# Patient Record
Sex: Female | Born: 1960 | Race: White | Hispanic: No | Marital: Married | Smoking: Former smoker
Health system: Southern US, Community
[De-identification: ages and names within clinical notes are randomized; demographics above are authoritative.]

## PROBLEM LIST (undated history)

## (undated) DIAGNOSIS — G4700 Insomnia, unspecified: Secondary | ICD-10-CM

## (undated) DIAGNOSIS — E7800 Pure hypercholesterolemia, unspecified: Secondary | ICD-10-CM

## (undated) DIAGNOSIS — F32A Depression, unspecified: Secondary | ICD-10-CM

## (undated) DIAGNOSIS — F419 Anxiety disorder, unspecified: Secondary | ICD-10-CM

## (undated) DIAGNOSIS — D259 Leiomyoma of uterus, unspecified: Secondary | ICD-10-CM

## (undated) DIAGNOSIS — F329 Major depressive disorder, single episode, unspecified: Secondary | ICD-10-CM

## (undated) DIAGNOSIS — K635 Polyp of colon: Secondary | ICD-10-CM

## (undated) HISTORY — DX: Polyp of colon: K63.5

## (undated) HISTORY — DX: Pure hypercholesterolemia, unspecified: E78.00

## (undated) HISTORY — PX: WRIST SURGERY: SHX841

---

## 1998-02-28 ENCOUNTER — Other Ambulatory Visit: Admission: RE | Admit: 1998-02-28 | Discharge: 1998-02-28 | Payer: Self-pay | Admitting: Obstetrics and Gynecology

## 1999-03-04 ENCOUNTER — Other Ambulatory Visit: Admission: RE | Admit: 1999-03-04 | Discharge: 1999-03-04 | Payer: Self-pay | Admitting: Obstetrics and Gynecology

## 2000-05-08 ENCOUNTER — Other Ambulatory Visit: Admission: RE | Admit: 2000-05-08 | Discharge: 2000-05-08 | Payer: Self-pay | Admitting: Obstetrics and Gynecology

## 2001-08-16 ENCOUNTER — Other Ambulatory Visit: Admission: RE | Admit: 2001-08-16 | Discharge: 2001-08-16 | Payer: Self-pay | Admitting: Obstetrics and Gynecology

## 2003-01-13 ENCOUNTER — Other Ambulatory Visit: Admission: RE | Admit: 2003-01-13 | Discharge: 2003-01-13 | Payer: Self-pay | Admitting: Obstetrics and Gynecology

## 2004-01-26 ENCOUNTER — Other Ambulatory Visit: Admission: RE | Admit: 2004-01-26 | Discharge: 2004-01-26 | Payer: Self-pay | Admitting: Obstetrics and Gynecology

## 2005-06-26 ENCOUNTER — Other Ambulatory Visit: Admission: RE | Admit: 2005-06-26 | Discharge: 2005-06-26 | Payer: Self-pay | Admitting: Obstetrics and Gynecology

## 2007-12-05 ENCOUNTER — Emergency Department (HOSPITAL_BASED_OUTPATIENT_CLINIC_OR_DEPARTMENT_OTHER): Admission: EM | Admit: 2007-12-05 | Discharge: 2007-12-05 | Payer: Self-pay | Admitting: Emergency Medicine

## 2010-09-20 ENCOUNTER — Other Ambulatory Visit: Payer: Self-pay | Admitting: Obstetrics and Gynecology

## 2010-09-20 DIAGNOSIS — Z09 Encounter for follow-up examination after completed treatment for conditions other than malignant neoplasm: Secondary | ICD-10-CM

## 2010-09-20 DIAGNOSIS — N6459 Other signs and symptoms in breast: Secondary | ICD-10-CM

## 2011-01-22 DIAGNOSIS — E569 Vitamin deficiency, unspecified: Secondary | ICD-10-CM | POA: Insufficient documentation

## 2011-01-22 DIAGNOSIS — G4700 Insomnia, unspecified: Secondary | ICD-10-CM | POA: Insufficient documentation

## 2011-02-27 ENCOUNTER — Encounter: Payer: Self-pay | Admitting: *Deleted

## 2011-02-27 ENCOUNTER — Emergency Department (INDEPENDENT_AMBULATORY_CARE_PROVIDER_SITE_OTHER): Payer: BC Managed Care – PPO

## 2011-02-27 ENCOUNTER — Emergency Department (HOSPITAL_BASED_OUTPATIENT_CLINIC_OR_DEPARTMENT_OTHER)
Admission: EM | Admit: 2011-02-27 | Discharge: 2011-02-27 | Disposition: A | Discharge status: Home / Self Care | Payer: BC Managed Care – PPO | Attending: Emergency Medicine | Admitting: Emergency Medicine

## 2011-02-27 DIAGNOSIS — M542 Cervicalgia: Secondary | ICD-10-CM

## 2011-02-27 DIAGNOSIS — M79609 Pain in unspecified limb: Secondary | ICD-10-CM

## 2011-02-27 DIAGNOSIS — M47812 Spondylosis without myelopathy or radiculopathy, cervical region: Secondary | ICD-10-CM

## 2011-02-27 DIAGNOSIS — F341 Dysthymic disorder: Secondary | ICD-10-CM | POA: Insufficient documentation

## 2011-02-27 DIAGNOSIS — M5412 Radiculopathy, cervical region: Secondary | ICD-10-CM | POA: Insufficient documentation

## 2011-02-27 HISTORY — DX: Depression, unspecified: F32.A

## 2011-02-27 HISTORY — DX: Anxiety disorder, unspecified: F41.9

## 2011-02-27 HISTORY — DX: Insomnia, unspecified: G47.00

## 2011-02-27 HISTORY — DX: Major depressive disorder, single episode, unspecified: F32.9

## 2011-02-27 LAB — CBC
HCT: 35.7 % — ABNORMAL LOW (ref 36.0–46.0)
MCH: 29.9 pg (ref 26.0–34.0)
MCHC: 33.6 g/dL (ref 30.0–36.0)
MCV: 88.8 fL (ref 78.0–100.0)
Platelets: 286 10*3/uL (ref 150–400)
RDW: 12.9 % (ref 11.5–15.5)

## 2011-02-27 LAB — DIFFERENTIAL
Basophils Absolute: 0 10*3/uL (ref 0.0–0.1)
Basophils Relative: 0 % (ref 0–1)
Eosinophils Absolute: 0.2 10*3/uL (ref 0.0–0.7)
Eosinophils Relative: 2 % (ref 0–5)
Monocytes Absolute: 0.5 10*3/uL (ref 0.1–1.0)

## 2011-02-27 LAB — COMPREHENSIVE METABOLIC PANEL
AST: 13 U/L (ref 0–37)
CO2: 28 mEq/L (ref 19–32)
Calcium: 9.2 mg/dL (ref 8.4–10.5)
Creatinine, Ser: 0.6 mg/dL (ref 0.50–1.10)
GFR calc non Af Amer: >90 mL/min (ref >90)
Sodium: 139 mEq/L (ref 135–145)
Total Protein: 6.3 g/dL (ref 6.0–8.3)

## 2011-02-27 MED ORDER — METHOCARBAMOL 500 MG PO TABS: 500.0000 mg | ORAL_TABLET | Freq: Two times a day (BID) | ORAL | Status: AC

## 2011-02-27 MED ORDER — PREDNISONE 10 MG PO TABS: ORAL_TABLET | ORAL | Status: DC

## 2011-02-27 MED ORDER — HYDROCODONE-ACETAMINOPHEN 5-325 MG PO TABS: 2.0000 | ORAL_TABLET | ORAL | Status: AC | PRN

## 2011-02-27 MED ORDER — HYDROCODONE-ACETAMINOPHEN 5-325 MG PO TABS
1.0000 | ORAL_TABLET | Freq: Once | ORAL | Status: AC
  Administered 2011-02-27: 1 via ORAL
  Filled 2011-02-27: qty 1

## 2011-02-27 NOTE — ED Notes (Signed)
Pt reports that she had a stressful life changing event in March, started prozac and [place] shortly after that states she began losing weight and has lost 40 pounds since march however she does report that the weight loss has leveled off in the last couple of months

## 2011-02-27 NOTE — ED Notes (Signed)
Transported to xray 

## 2011-02-27 NOTE — ED Notes (Signed)
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 Pt also reports for the last few days she has become "lightheaded" at intervals

## 2011-02-27 NOTE — ED Provider Notes (Signed)
History     CSN: [id number] Arrival date & time: 02/27/2011 12:18 PM  Chief Complaint  Patient presents with  . Neck Pain    (Consider location/radiation/quality/duration/timing/severity/associated sxs/prior treatment) Patient is a 50 y.o. female presenting with neck pain. The history is provided by the patient. No language interpreter was used.  Neck Pain  This is a recurrent problem. The current episode started more than 1 week ago. The problem occurs constantly. The problem has been gradually worsening. The pain is associated with nothing. There has been no fever. The pain is present in the generalized neck. The quality of the pain is described as shooting. The pain is at a severity of 9/10. The pain is moderate. The symptoms are aggravated by bending. The pain is worse during the day. Associated symptoms include numbness. She has tried nothing for the symptoms. The treatment provided moderate relief.    Past Medical History  Diagnosis Date  . Depression   . Anxiety   . Insomnia     History reviewed. No pertinent past surgical history.  Family History  Problem Relation Age of Onset  . Thyroid disease Mother     History  Substance Use Topics  . Smoking status: Never Smoker   . Smokeless tobacco: Not on file  . Alcohol Use: Yes     occ    OB History    Grav Para Term Preterm Abortions TAB SAB Ect Mult Living                  Review of Systems  HENT: Positive for neck pain.   Musculoskeletal: Negative for myalgias and joint swelling.  Neurological: Positive for numbness.  All other systems reviewed and are negative.    Allergies  Review of patient's allergies indicates no known allergies.  Home Medications   Current Outpatient Rx  Name Route Sig Dispense Refill  . FLUOXETINE HCL 40 MG PO CAPS Oral Take 40 mg by mouth daily.      Marland Kitchen ZOLPIDEM TARTRATE 10 MG PO TABS Oral Take 10 mg by mouth at bedtime as needed.        BP 124/80  Pulse 66  Temp(Src) 98.5  F (36.9 C) (Oral)  Resp 20  SpO2 100%  LMP 02/15/2011  Physical Exam  Nursing note and vitals reviewed. Constitutional: She is oriented to person, place, and time. She appears well-developed and well-nourished.  HENT:  Head: Normocephalic and atraumatic.  Eyes: Conjunctivae and EOM are normal. Pupils are equal, round, and reactive to light.  Neck: Normal range of motion. Neck supple.  Cardiovascular: Normal rate.   Pulmonary/Chest: Effort normal.  Abdominal: Soft.  Musculoskeletal: Normal range of motion. She exhibits tenderness.  Neurological: She is alert and oriented to person, place, and time. She has normal reflexes.  Skin: Skin is warm and dry.  Psychiatric: She has a normal mood and affect.    ED Course  Procedures (including critical care time)  Labs Reviewed - No data to display No results found.   No diagnosis found.    MDM  C spine shows spurring and neural foraminal narrowing.   Pt referred to her MD for followup,  Dr. Reinaldo Raddle neurosurgery.  Pt started on prednisone and hydrocodone        Langston Masker, [place] 02/27/11 1430

## 2011-03-02 NOTE — ED Provider Notes (Signed)
Medical screening examination/treatment/procedure(s) were performed by non-physician practitioner and as supervising physician I was immediately available for consultation/collaboration. Gerhard Munch, MD, MA  Gerhard Munch, MD 03/02/11 2142

## 2011-09-23 ENCOUNTER — Other Ambulatory Visit: Payer: Self-pay | Admitting: Obstetrics and Gynecology

## 2011-09-23 DIAGNOSIS — N6459 Other signs and symptoms in breast: Secondary | ICD-10-CM

## 2011-10-03 ENCOUNTER — Other Ambulatory Visit: Payer: BC Managed Care – PPO

## 2011-11-03 ENCOUNTER — Other Ambulatory Visit: Payer: BC Managed Care – PPO

## 2011-11-10 ENCOUNTER — Other Ambulatory Visit: Payer: BC Managed Care – PPO

## 2012-10-01 ENCOUNTER — Other Ambulatory Visit: Payer: Self-pay

## 2012-10-01 DIAGNOSIS — Z1231 Encounter for screening mammogram for malignant neoplasm of breast: Secondary | ICD-10-CM

## 2012-11-02 ENCOUNTER — Ambulatory Visit
Admission: RE | Admit: 2012-11-02 | Discharge: 2012-11-02 | Disposition: A | Discharge status: Home / Self Care | Payer: BC Managed Care – PPO | Source: Ambulatory Visit

## 2012-11-02 DIAGNOSIS — Z1231 Encounter for screening mammogram for malignant neoplasm of breast: Secondary | ICD-10-CM

## 2014-03-23 DIAGNOSIS — R87610 Atypical squamous cells of undetermined significance on cytologic smear of cervix (ASC-US): Secondary | ICD-10-CM | POA: Insufficient documentation

## 2016-05-03 HISTORY — PX: ABDOMINAL HYSTERECTOMY: SHX81

## 2016-05-07 ENCOUNTER — Observation Stay (HOSPITAL_COMMUNITY)
Admission: AD | Admit: 2016-05-07 | Discharge: 2016-05-09 | Disposition: A | Discharge status: Home / Self Care | Payer: BLUE CROSS/BLUE SHIELD | Source: Ambulatory Visit | Attending: Obstetrics and Gynecology | Admitting: Obstetrics and Gynecology

## 2016-05-07 ENCOUNTER — Encounter (HOSPITAL_COMMUNITY): Payer: Self-pay | Admitting: *Deleted

## 2016-05-07 DIAGNOSIS — R5082 Postprocedural fever: Principal | ICD-10-CM

## 2016-05-07 HISTORY — DX: Leiomyoma of uterus, unspecified: D25.9

## 2016-05-07 LAB — CBC WITH DIFFERENTIAL/PLATELET
BASOS ABS: 0 10*3/uL (ref 0.0–0.1)
Basophils Relative: 0 %
Eosinophils Absolute: 0.1 10*3/uL (ref 0.0–0.7)
Eosinophils Relative: 0 %
HEMATOCRIT: 33.6 % — AB (ref 36.0–46.0)
HEMOGLOBIN: 11 g/dL — AB (ref 12.0–15.0)
LYMPHS PCT: 13 %
Lymphs Abs: 2.3 10*3/uL (ref 0.7–4.0)
MCH: 26.6 pg (ref 26.0–34.0)
MCHC: 32.7 g/dL (ref 30.0–36.0)
MCV: 81.2 fL (ref 78.0–100.0)
MONO ABS: 0.7 10*3/uL (ref 0.1–1.0)
Monocytes Relative: 4 %
NEUTROS ABS: 14.4 10*3/uL — AB (ref 1.7–7.7)
NEUTROS PCT: 83 %
Platelets: 346 10*3/uL (ref 150–400)
RBC: 4.14 MIL/uL (ref 3.87–5.11)
RDW: 14 % (ref 11.5–15.5)
WBC: 17.5 10*3/uL — ABNORMAL HIGH (ref 4.0–10.5)

## 2016-05-07 LAB — INFLUENZA PANEL BY PCR (TYPE A & B)
Influenza A By PCR: NEGATIVE
Influenza B By PCR: NEGATIVE

## 2016-05-07 LAB — URINALYSIS, ROUTINE W REFLEX MICROSCOPIC
Bacteria, UA: NONE SEEN
Bilirubin Urine: NEGATIVE
GLUCOSE, UA: NEGATIVE mg/dL
Ketones, ur: 5 mg/dL — AB
Nitrite: NEGATIVE
PROTEIN: NEGATIVE mg/dL
Specific Gravity, Urine: 1.01 (ref 1.005–1.030)
pH: 6 (ref 5.0–8.0)

## 2016-05-07 MED ORDER — ONDANSETRON HCL 4 MG PO TABS: 4.0000 mg | ORAL_TABLET | Freq: Four times a day (QID) | ORAL | Status: DC | PRN

## 2016-05-07 MED ORDER — ONDANSETRON HCL 4 MG/2ML IJ SOLN: 4.0000 mg | Freq: Four times a day (QID) | INTRAMUSCULAR | Status: DC | PRN

## 2016-05-07 MED ORDER — POLYETHYLENE GLYCOL 3350 17 G PO PACK
17.0000 g | PACK | Freq: Every day | ORAL | Status: DC
  Administered 2016-05-07 – 2016-05-09 (×3): 17 g via ORAL
  Filled 2016-05-07 (×3): qty 1

## 2016-05-07 MED ORDER — PIPERACILLIN-TAZOBACTAM 3.375 G IVPB 30 MIN
3.3750 g | Freq: Once | INTRAVENOUS | Status: AC
  Administered 2016-05-07: 3.375 g via INTRAVENOUS
  Filled 2016-05-07: qty 50

## 2016-05-07 MED ORDER — PIPERACILLIN-TAZOBACTAM 3.375 G IVPB
3.3750 g | Freq: Three times a day (TID) | INTRAVENOUS | Status: DC
  Administered 2016-05-08 – 2016-05-09 (×4): 3.375 g via INTRAVENOUS
  Filled 2016-05-07 (×5): qty 50

## 2016-05-07 MED ORDER — SODIUM CHLORIDE 0.9 % IV SOLN
INTRAVENOUS | Status: DC
  Administered 2016-05-07 – 2016-05-08 (×2): via INTRAVENOUS

## 2016-05-07 MED ORDER — FLUTICASONE PROPIONATE 50 MCG/ACT NA SUSP
2.0000 | Freq: Every day | NASAL | Status: DC
  Filled 2016-05-07: qty 16

## 2016-05-07 MED ORDER — MENTHOL 3 MG MT LOZG: 1.0000 | LOZENGE | OROMUCOSAL | Status: DC | PRN

## 2016-05-07 MED ORDER — OXYCODONE-ACETAMINOPHEN 5-325 MG PO TABS
1.0000 | ORAL_TABLET | ORAL | Status: DC | PRN
  Filled 2016-05-07: qty 1

## 2016-05-07 MED ORDER — TRIAMCINOLONE ACETONIDE 55 MCG/ACT NA AERO
2.0000 | INHALATION_SPRAY | Freq: Every day | NASAL | Status: DC
  Filled 2016-05-07: qty 21.6

## 2016-05-07 MED ORDER — BISACODYL 10 MG RE SUPP
10.0000 mg | Freq: Every day | RECTAL | Status: DC | PRN
  Administered 2016-05-08 – 2016-05-09 (×2): 10 mg via RECTAL
  Filled 2016-05-07 (×2): qty 1

## 2016-05-07 MED ORDER — IBUPROFEN 600 MG PO TABS
600.0000 mg | ORAL_TABLET | Freq: Four times a day (QID) | ORAL | Status: DC | PRN
  Administered 2016-05-07 – 2016-05-08 (×3): 600 mg via ORAL
  Filled 2016-05-07 (×3): qty 1

## 2016-05-07 MED ORDER — MAGNESIUM HYDROXIDE 400 MG/5ML PO SUSP
15.0000 mL | Freq: Every day | ORAL | Status: DC
  Administered 2016-05-07 – 2016-05-09 (×2): 15 mL via ORAL
  Filled 2016-05-07 (×2): qty 30

## 2016-05-07 MED ORDER — ACETAMINOPHEN 325 MG PO TABS
650.0000 mg | ORAL_TABLET | Freq: Four times a day (QID) | ORAL | Status: DC | PRN
  Administered 2016-05-08 – 2016-05-09 (×4): 650 mg via ORAL
  Filled 2016-05-07 (×4): qty 2

## 2016-05-07 MED ORDER — ACETAMINOPHEN 325 MG PO TABS
650.0000 mg | ORAL_TABLET | Freq: Once | ORAL | Status: AC
  Administered 2016-05-07: 650 mg via ORAL
  Filled 2016-05-07: qty 2

## 2016-05-07 NOTE — MAU Note (Addendum)
Pt had hysterectomy on 12/4 by Dr. Gaetano Net, began having severe HA last night, is somewhat better now.  Unable to eat, cramping, felt like she had a fever.  Temp was 101.6 @ 1700 this evening. Pt last took tylenol @ 1400.  Was advised to come to MAU.

## 2016-05-07 NOTE — H&P (Signed)
Gabriela Howard is an 55 y.o. female s/p LAVH 04/28/16 presents w/ fever.  Pt reports severe HA today and feeling unwell.  Temp @ home 101.7.   Pt reports persistent cramping since surgery - using tylenol prn for pain.  Pt reports last BM 3 d ago and flatus 2d ago.  Tolerating a regular diet until starting feeling bad today - reports decrease in appetite - no emesis.  Denies cough, CP & SOB.  Denies dysuria, hematuria, & frequency of urination.  Denies VB.  Reports HA now improved.     Menstrual History: Patient's last menstrual period was 02/15/2011.    Past Medical History:  Diagnosis Date  . Anxiety   . Depression   . Insomnia   . Uterine fibroid     Past Surgical History:  Procedure Laterality Date  . ABDOMINAL HYSTERECTOMY  05/03/2016   LAVH, total  . WRIST SURGERY      Family History  Problem Relation Age of Onset  . Thyroid disease Mother     Social History:  reports that she has never smoked. She has never used smokeless tobacco. She reports that she drinks alcohol. She reports that she does not use drugs.  Allergies:  Allergies  Allergen Reactions  . Aleve [Naproxen Sodium] Other (See Comments)    Reaction:  GI upset   . Sulfa Antibiotics Other (See Comments)    Reaction:  Unknown     Prescriptions Prior to Admission  Medication Sig Dispense Refill Last Dose  . cholecalciferol (VITAMIN D) 1000 units tablet Take 2,000 Units by mouth daily.   Past Month at Unknown time  . magnesium hydroxide (MILK OF MAGNESIA) 400 MG/5ML suspension Take 30 mLs by mouth daily as needed for mild constipation.   Past Week at Unknown time  . Multiple Vitamin (MULTIVITAMIN WITH MINERALS) TABS tablet Take 1 tablet by mouth daily.   Past Month at Unknown time  . oxyCODONE-acetaminophen (PERCOCET/ROXICET) 5-325 MG tablet Take 1-2 tablets by mouth every 6 (six) hours as needed for severe pain.   Past Week at Unknown time  . triamcinolone (NASACORT ALLERGY 24HR) 55 MCG/ACT AERO nasal inhaler  Place 2 sprays into the nose at bedtime as needed (for rhinitis).   Past Week at Unknown time  . Turmeric 500 MG CAPS Take 1,000 mg by mouth at bedtime.   05/06/2016 at Unknown time    ROS  Blood pressure 125/75, pulse 98, temperature 99.1 F (37.3 C), temperature source Oral, resp. rate 20, height 5\' 3"  (1.6 m), weight 167 lb 0.9 oz (75.8 kg), last menstrual period 02/15/2011, SpO2 96 %. Physical Exam  Gen - NAD CV - RRR Lungs - clear Abd - soft, ND, mildly tender BLQ.  + BS.  No R/G Ext - NT PV - deferred  Results for orders placed or performed during the hospital encounter of 05/07/16 (from the past 24 hour(s))  Urinalysis, Routine w reflex microscopic     Status: Abnormal   Collection Time: 05/07/16  6:57 PM  Result Value Ref Range   Color, Urine YELLOW YELLOW   APPearance CLEAR CLEAR   Specific Gravity, Urine 1.010 1.005 - 1.030   pH 6.0 5.0 - 8.0   Glucose, UA NEGATIVE NEGATIVE mg/dL   Hgb urine dipstick MODERATE (A) NEGATIVE   Bilirubin Urine NEGATIVE NEGATIVE   Ketones, ur 5 (A) NEGATIVE mg/dL   Protein, ur NEGATIVE NEGATIVE mg/dL   Nitrite NEGATIVE NEGATIVE   Leukocytes, UA TRACE (A) NEGATIVE   RBC /  HPF 0-5 0 - 5 RBC/hpf   WBC, UA 6-30 0 - 5 WBC/hpf   Bacteria, UA NONE SEEN NONE SEEN   Squamous Epithelial / LPF 0-5 (A) NONE SEEN   Mucous PRESENT   CBC with Differential     Status: Abnormal   Collection Time: 05/07/16  7:23 PM  Result Value Ref Range   WBC 17.5 (H) 4.0 - 10.5 K/uL   RBC 4.14 3.87 - 5.11 MIL/uL   Hemoglobin 11.0 (L) 12.0 - 15.0 g/dL   HCT 33.6 (L) 36.0 - 46.0 %   MCV 81.2 78.0 - 100.0 fL   MCH 26.6 26.0 - 34.0 pg   MCHC 32.7 30.0 - 36.0 g/dL   RDW 14.0 11.5 - 15.5 %   Platelets 346 150 - 400 K/uL   Neutrophils Relative % 83 %   Neutro Abs 14.4 (H) 1.7 - 7.7 K/uL   Lymphocytes Relative 13 %   Lymphs Abs 2.3 0.7 - 4.0 K/uL   Monocytes Relative 4 %   Monocytes Absolute 0.7 0.1 - 1.0 K/uL   Eosinophils Relative 0 %   Eosinophils Absolute 0.1  0.0 - 0.7 K/uL   Basophils Relative 0 %   Basophils Absolute 0.0 0.0 - 0.1 K/uL    Assessment/Plan: Postoperative fever s/p LAVH Admit IV zosyn IVF Repeat cbc in am - will consider imaging if not responding to broad spectrum abx Start bowel regimen - MOM, miralax, dulcolax.   Tylenol & ibuprofen prn pain (pt has tolerated ibu in past, declines narcotics b/c of constipation  Faye Strohman 05/07/2016, 9:23 PM

## 2016-05-07 NOTE — MAU Provider Note (Signed)
History     CSN: [id number]  Arrival date and time: 05/07/16 [id number]   First Provider Initiated Contact with Patient 05/07/16 1924       Chief Complaint  Patient presents with  . Fever  . Post-op Problem   HPI  Gabriela Howard is a 55 y.o. female who presents 9 days s/p LAVH with fever. Reports "feeling bad" since last night & felt like she may have a fever but didn't check her temperature until this evening when her spouse bought a thermometer; at 5 pm her temp was 101.5. Took 2 ES tylenol at 2 pm. Endorses headache since last night. Rates pain 7/10. Nothing makes better or worse. Denies sore throat, cough, SOB, dysuria, body aches, or sick contacts. Received flu vaccine this season. Some abdominal cramping since surgery. Reports cramping as mild and no change since surgery. No vaginal bleeding. Some brown discharge since surgery.   Past Medical History:  Diagnosis Date  . Anxiety   . Depression   . Insomnia   . Uterine fibroid     Past Surgical History:  Procedure Laterality Date  . ABDOMINAL HYSTERECTOMY  05/03/2016   LAVH, total  . WRIST SURGERY      Family History  Problem Relation Age of Onset  . Thyroid disease Mother     Social History  Substance Use Topics  . Smoking status: Never Smoker  . Smokeless tobacco: Not on file  . Alcohol use Yes     Comment: occ    Allergies: No Known Allergies  Prescriptions Prior to Admission  Medication Sig Dispense Refill Last Dose  . FLUoxetine (PROZAC) 40 MG capsule Take 40 mg by mouth daily.     More than a month at Unknown time  . predniSONE (DELTASONE) 10 MG tablet 6,5,4,3,2,1 taper 15 tablet 0 More than a month at Unknown time  . zolpidem (AMBIEN) 10 MG tablet Take 10 mg by mouth at bedtime as needed.     More than a month at Unknown time    Review of Systems  Constitutional: Positive for chills and fever. Negative for malaise/fatigue.  HENT: Negative.   Respiratory: Negative.   Cardiovascular: Negative.    Gastrointestinal: Positive for abdominal pain and constipation. Negative for diarrhea, nausea and vomiting.  Genitourinary: Negative.   Neurological: Positive for headaches.   Physical Exam   Blood pressure 136/79, pulse 113, temperature 102 F (38.9 C), temperature source Oral, resp. rate 18, last menstrual period 02/15/2011.  Temp:  [100.7 F (38.2 C)-102 F (38.9 C)] 100.7 F (38.2 C) (12/13 1950) Pulse Rate:  [89-113] 89 (12/13 1927) Resp:  [18] 18 (12/13 1927) BP: (106-136)/(73-79) 106/73 (12/13 1927)   Physical Exam  Nursing note and vitals reviewed. Constitutional: She is oriented to person, place, and time. She appears well-developed and well-nourished. No distress.  HENT:  Head: Normocephalic and atraumatic.  Eyes: Conjunctivae are normal. Right eye exhibits no discharge. Left eye exhibits no discharge. No scleral icterus.  Neck: Normal range of motion.  Cardiovascular: Regular rhythm and normal heart sounds.  Tachycardia present.   No murmur heard. Respiratory: Effort normal and breath sounds normal. No respiratory distress. She has no wheezes.  GI: Soft. Bowel sounds are normal. She exhibits no distension. There is tenderness (mild tenderness of lower abdomen). There is no rigidity, no rebound, no guarding and no CVA tenderness.  Neurological: She is alert and oriented to person, place, and time.  Skin: Skin is warm and dry. She is not diaphoretic.  Psychiatric: She has a normal mood and affect. Her behavior is normal. Judgment and thought content normal.    MAU Course  Procedures Results for orders placed or performed during the hospital encounter of 05/07/16 (from the past 24 hour(s))  Urinalysis, Routine w reflex microscopic     Status: Abnormal   Collection Time: 05/07/16  6:57 PM  Result Value Ref Range   Color, Urine YELLOW YELLOW   APPearance CLEAR CLEAR   Specific Gravity, Urine 1.010 1.005 - 1.030   pH 6.0 5.0 - 8.0   Glucose, UA NEGATIVE NEGATIVE  mg/dL   Hgb urine dipstick MODERATE (A) NEGATIVE   Bilirubin Urine NEGATIVE NEGATIVE   Ketones, ur 5 (A) NEGATIVE mg/dL   Protein, ur NEGATIVE NEGATIVE mg/dL   Nitrite NEGATIVE NEGATIVE   Leukocytes, UA TRACE (A) NEGATIVE   RBC / HPF 0-5 0 - 5 RBC/hpf   WBC, UA 6-30 0 - 5 WBC/hpf   Bacteria, UA NONE SEEN NONE SEEN   Squamous Epithelial / LPF 0-5 (A) NONE SEEN   Mucous PRESENT   CBC with Differential     Status: Abnormal   Collection Time: 05/07/16  7:23 PM  Result Value Ref Range   WBC 17.5 (H) 4.0 - 10.5 K/uL   RBC 4.14 3.87 - 5.11 MIL/uL   Hemoglobin 11.0 (L) 12.0 - 15.0 g/dL   HCT 33.6 (L) 36.0 - 46.0 %   MCV 81.2 78.0 - 100.0 fL   MCH 26.6 26.0 - 34.0 pg   MCHC 32.7 30.0 - 36.0 g/dL   RDW 14.0 11.5 - 15.5 %   Platelets 346 150 - 400 K/uL   Neutrophils Relative % 83 %   Neutro Abs 14.4 (H) 1.7 - 7.7 K/uL   Lymphocytes Relative 13 %   Lymphs Abs 2.3 0.7 - 4.0 K/uL   Monocytes Relative 4 %   Monocytes Absolute 0.7 0.1 - 1.0 K/uL   Eosinophils Relative 0 %   Eosinophils Absolute 0.1 0.0 - 0.7 K/uL   Basophils Relative 0 %   Basophils Absolute 0.0 0.0 - 0.1 K/uL    MDM CBC w/diff Tylenol 650 mg PO Temp 102>100.7 S/w Dr. Julien Girt. Will obs on Women's unit.   Assessment and Plan  A: Post op fever   P: Obs on women's unit IV fluids & abx Flu swab collected  Jorje Guild 05/07/2016, 7:24 PM

## 2016-05-08 LAB — COMPREHENSIVE METABOLIC PANEL
ALK PHOS: 57 U/L (ref 38–126)
ALT: 11 U/L — AB (ref 14–54)
ANION GAP: 6 (ref 5–15)
AST: 11 U/L — ABNORMAL LOW (ref 15–41)
Albumin: 3.3 g/dL — ABNORMAL LOW (ref 3.5–5.0)
BUN: 9 mg/dL (ref 6–20)
CO2: 28 mmol/L (ref 22–32)
CREATININE: 0.71 mg/dL (ref 0.44–1.00)
Calcium: 8.5 mg/dL — ABNORMAL LOW (ref 8.9–10.3)
Chloride: 107 mmol/L (ref 101–111)
Glucose, Bld: 102 mg/dL — ABNORMAL HIGH (ref 65–99)
Potassium: 4.2 mmol/L (ref 3.5–5.1)
Sodium: 141 mmol/L (ref 135–145)
Total Bilirubin: 0.5 mg/dL (ref 0.3–1.2)
Total Protein: 6.2 g/dL — ABNORMAL LOW (ref 6.5–8.1)

## 2016-05-08 LAB — CBC
HCT: 31.4 % — ABNORMAL LOW (ref 36.0–46.0)
HEMOGLOBIN: 10.5 g/dL — AB (ref 12.0–15.0)
MCH: 27.3 pg (ref 26.0–34.0)
MCHC: 33.4 g/dL (ref 30.0–36.0)
MCV: 81.8 fL (ref 78.0–100.0)
PLATELETS: 305 10*3/uL (ref 150–400)
RBC: 3.84 MIL/uL — AB (ref 3.87–5.11)
RDW: 14.2 % (ref 11.5–15.5)
WBC: 15.9 10*3/uL — ABNORMAL HIGH (ref 4.0–10.5)

## 2016-05-08 NOTE — Progress Notes (Signed)
Patient has been complaining of constant abdominal aching with a pain score 4-7 out of 10.  Pain med has been offered to patient every hour and she has refused.  She states "nothing helps. I don't want any thing". VSS , afebrile, skin warm and dry, active bowel sounds positive flatus, nontender, flat affect. Voiding without difficulty, no vaginal drainage.

## 2016-05-08 NOTE — Progress Notes (Signed)
Feeling much better today No HA, no N/V +BM after suppository Tolerated regular diet for breakfast Ambulating without difficulty   Vitals:   05/08/16 0428 05/08/16 0815  BP: (!) 91/52 (!) 98/52  Pulse: 75 86  Resp: 20 18  Temp: 97.8 F (36.6 C) 98.3 F (36.8 C)   Lungs CTA Abdomen soft, NT, good BS x 4 LE NT without cords  Results for orders placed or performed during the hospital encounter of 05/07/16 (from the past 24 hour(s))  Urinalysis, Routine w reflex microscopic     Status: Abnormal   Collection Time: 05/07/16  6:57 PM  Result Value Ref Range   Color, Urine YELLOW YELLOW   APPearance CLEAR CLEAR   Specific Gravity, Urine 1.010 1.005 - 1.030   pH 6.0 5.0 - 8.0   Glucose, UA NEGATIVE NEGATIVE mg/dL   Hgb urine dipstick MODERATE (A) NEGATIVE   Bilirubin Urine NEGATIVE NEGATIVE   Ketones, ur 5 (A) NEGATIVE mg/dL   Protein, ur NEGATIVE NEGATIVE mg/dL   Nitrite NEGATIVE NEGATIVE   Leukocytes, UA TRACE (A) NEGATIVE   RBC / HPF 0-5 0 - 5 RBC/hpf   WBC, UA 6-30 0 - 5 WBC/hpf   Bacteria, UA NONE SEEN NONE SEEN   Squamous Epithelial / LPF 0-5 (A) NONE SEEN   Mucous PRESENT   CBC with Differential     Status: Abnormal   Collection Time: 05/07/16  7:23 PM  Result Value Ref Range   WBC 17.5 (H) 4.0 - 10.5 K/uL   RBC 4.14 3.87 - 5.11 MIL/uL   Hemoglobin 11.0 (L) 12.0 - 15.0 g/dL   HCT 33.6 (L) 36.0 - 46.0 %   MCV 81.2 78.0 - 100.0 fL   MCH 26.6 26.0 - 34.0 pg   MCHC 32.7 30.0 - 36.0 g/dL   RDW 14.0 11.5 - 15.5 %   Platelets 346 150 - 400 K/uL   Neutrophils Relative % 83 %   Neutro Abs 14.4 (H) 1.7 - 7.7 K/uL   Lymphocytes Relative 13 %   Lymphs Abs 2.3 0.7 - 4.0 K/uL   Monocytes Relative 4 %   Monocytes Absolute 0.7 0.1 - 1.0 K/uL   Eosinophils Relative 0 %   Eosinophils Absolute 0.1 0.0 - 0.7 K/uL   Basophils Relative 0 %   Basophils Absolute 0.0 0.0 - 0.1 K/uL  Influenza panel by PCR (type A & B, H1N1)     Status: None   Collection Time: 05/07/16  8:27 PM   Result Value Ref Range   Influenza A By PCR NEGATIVE NEGATIVE   Influenza B By PCR NEGATIVE NEGATIVE  CBC     Status: Abnormal   Collection Time: 05/08/16  5:41 AM  Result Value Ref Range   WBC 15.9 (H) 4.0 - 10.5 K/uL   RBC 3.84 (L) 3.87 - 5.11 MIL/uL   Hemoglobin 10.5 (L) 12.0 - 15.0 g/dL   HCT 31.4 (L) 36.0 - 46.0 %   MCV 81.8 78.0 - 100.0 fL   MCH 27.3 26.0 - 34.0 pg   MCHC 33.4 30.0 - 36.0 g/dL   RDW 14.2 11.5 - 15.5 %   Platelets 305 150 - 400 K/uL  Comprehensive metabolic panel     Status: Abnormal   Collection Time: 05/08/16  5:41 AM  Result Value Ref Range   Sodium 141 135 - 145 mmol/L   Potassium 4.2 3.5 - 5.1 mmol/L   Chloride 107 101 - 111 mmol/L   CO2 28 22 - 32 mmol/L  Glucose, Bld 102 (H) 65 - 99 mg/dL   BUN 9 6 - 20 mg/dL   Creatinine, Ser 0.71 0.44 - 1.00 mg/dL   Calcium 8.5 (L) 8.9 - 10.3 mg/dL   Total Protein 6.2 (L) 6.5 - 8.1 g/dL   Albumin 3.3 (L) 3.5 - 5.0 g/dL   AST 11 (L) 15 - 41 U/L   ALT 11 (L) 14 - 54 U/L   Alkaline Phosphatase 57 38 - 126 U/L   Total Bilirubin 0.5 0.3 - 1.2 mg/dL   GFR calc non Af Amer >60 >60 mL/min   GFR calc Af Amer >60 >60 mL/min   Anion gap 6 5 - 15   A/P: Possible Cuff Cellulitis         Continue IV ATB until afebrile x 24 hours         D/W patient

## 2016-05-09 LAB — CBC
HCT: 31.8 % — ABNORMAL LOW (ref 36.0–46.0)
HEMOGLOBIN: 10.4 g/dL — AB (ref 12.0–15.0)
MCH: 27 pg (ref 26.0–34.0)
MCHC: 32.7 g/dL (ref 30.0–36.0)
MCV: 82.6 fL (ref 78.0–100.0)
Platelets: 306 10*3/uL (ref 150–400)
RBC: 3.85 MIL/uL — AB (ref 3.87–5.11)
RDW: 14.1 % (ref 11.5–15.5)
WBC: 11.7 10*3/uL — ABNORMAL HIGH (ref 4.0–10.5)

## 2016-05-09 MED ORDER — IBUPROFEN 600 MG PO TABS: 600.0000 mg | ORAL_TABLET | Freq: Four times a day (QID) | ORAL | 0 refills | Status: DC | PRN

## 2016-05-09 MED ORDER — AMOXICILLIN-POT CLAVULANATE 875-125 MG PO TABS: 1.0000 | ORAL_TABLET | Freq: Two times a day (BID) | ORAL | 0 refills | Status: DC

## 2016-05-09 NOTE — Progress Notes (Signed)
Feels much better +BM with suppository, tolerating regular diet, no N/V, ambulating well  VSS Afeb Abdomen very soft, good BS x 4  Results for orders placed or performed during the hospital encounter of 05/07/16 (from the past 48 hour(s))  Urinalysis, Routine w reflex microscopic     Status: Abnormal   Collection Time: 05/07/16  6:57 PM  Result Value Ref Range   Color, Urine YELLOW YELLOW   APPearance CLEAR CLEAR   Specific Gravity, Urine 1.010 1.005 - 1.030   pH 6.0 5.0 - 8.0   Glucose, UA NEGATIVE NEGATIVE mg/dL   Hgb urine dipstick MODERATE (A) NEGATIVE   Bilirubin Urine NEGATIVE NEGATIVE   Ketones, ur 5 (A) NEGATIVE mg/dL   Protein, ur NEGATIVE NEGATIVE mg/dL   Nitrite NEGATIVE NEGATIVE   Leukocytes, UA TRACE (A) NEGATIVE   RBC / HPF 0-5 0 - 5 RBC/hpf   WBC, UA 6-30 0 - 5 WBC/hpf   Bacteria, UA NONE SEEN NONE SEEN   Squamous Epithelial / LPF 0-5 (A) NONE SEEN   Mucous PRESENT   CBC with Differential     Status: Abnormal   Collection Time: 05/07/16  7:23 PM  Result Value Ref Range   WBC 17.5 (H) 4.0 - 10.5 K/uL   RBC 4.14 3.87 - 5.11 MIL/uL   Hemoglobin 11.0 (L) 12.0 - 15.0 g/dL   HCT 33.6 (L) 36.0 - 46.0 %   MCV 81.2 78.0 - 100.0 fL   MCH 26.6 26.0 - 34.0 pg   MCHC 32.7 30.0 - 36.0 g/dL   RDW 14.0 11.5 - 15.5 %   Platelets 346 150 - 400 K/uL   Neutrophils Relative % 83 %   Neutro Abs 14.4 (H) 1.7 - 7.7 K/uL   Lymphocytes Relative 13 %   Lymphs Abs 2.3 0.7 - 4.0 K/uL   Monocytes Relative 4 %   Monocytes Absolute 0.7 0.1 - 1.0 K/uL   Eosinophils Relative 0 %   Eosinophils Absolute 0.1 0.0 - 0.7 K/uL   Basophils Relative 0 %   Basophils Absolute 0.0 0.0 - 0.1 K/uL  Influenza panel by PCR (type A & B, H1N1)     Status: None   Collection Time: 05/07/16  8:27 PM  Result Value Ref Range   Influenza A By PCR NEGATIVE NEGATIVE   Influenza B By PCR NEGATIVE NEGATIVE    Comment: (NOTE) The Xpert Xpress Flu assay is intended as an aid in the diagnosis of  influenza and  should not be used as a sole basis for treatment.  This  assay is FDA approved for nasopharyngeal swab specimens only. Nasal  washings and aspirates are unacceptable for Xpert Xpress Flu testing.   CBC     Status: Abnormal   Collection Time: 05/08/16  5:41 AM  Result Value Ref Range   WBC 15.9 (H) 4.0 - 10.5 K/uL   RBC 3.84 (L) 3.87 - 5.11 MIL/uL   Hemoglobin 10.5 (L) 12.0 - 15.0 g/dL   HCT 31.4 (L) 36.0 - 46.0 %   MCV 81.8 78.0 - 100.0 fL   MCH 27.3 26.0 - 34.0 pg   MCHC 33.4 30.0 - 36.0 g/dL   RDW 14.2 11.5 - 15.5 %   Platelets 305 150 - 400 K/uL  Comprehensive metabolic panel     Status: Abnormal   Collection Time: 05/08/16  5:41 AM  Result Value Ref Range   Sodium 141 135 - 145 mmol/L   Potassium 4.2 3.5 - 5.1 mmol/L   Chloride  107 101 - 111 mmol/L   CO2 28 22 - 32 mmol/L   Glucose, Bld 102 (H) 65 - 99 mg/dL   BUN 9 6 - 20 mg/dL   Creatinine, Ser 0.71 0.44 - 1.00 mg/dL   Calcium 8.5 (L) 8.9 - 10.3 mg/dL   Total Protein 6.2 (L) 6.5 - 8.1 g/dL   Albumin 3.3 (L) 3.5 - 5.0 g/dL   AST 11 (L) 15 - 41 U/L   ALT 11 (L) 14 - 54 U/L   Alkaline Phosphatase 57 38 - 126 U/L   Total Bilirubin 0.5 0.3 - 1.2 mg/dL   GFR calc non Af Amer >60 >60 mL/min   GFR calc Af Amer >60 >60 mL/min    Comment: (NOTE) The eGFR has been calculated using the CKD EPI equation. This calculation has not been validated in all clinical situations. eGFR's persistently <60 mL/min signify possible Chronic Kidney Disease.    Anion gap 6 5 - 15  CBC     Status: Abnormal   Collection Time: 05/09/16  5:03 AM  Result Value Ref Range   WBC 11.7 (H) 4.0 - 10.5 K/uL   RBC 3.85 (L) 3.87 - 5.11 MIL/uL   Hemoglobin 10.4 (L) 12.0 - 15.0 g/dL   HCT 31.8 (L) 36.0 - 46.0 %   MCV 82.6 78.0 - 100.0 fL   MCH 27.0 26.0 - 34.0 pg   MCHC 32.7 30.0 - 36.0 g/dL   RDW 14.1 11.5 - 15.5 %   Platelets 306 150 - 400 K/uL   A/P: Probable cuff cellulitis-responding well to treatment         D/C home         Augmentin          D/W Milk of Magnesia prn, fluids, etc         FU office next week as scheduled

## 2016-05-09 NOTE — Progress Notes (Signed)
Results for Gabriela Howard, Gabriela Howard (MRN [id number]) as of 05/09/2016 06:28  Ref. Range 05/09/2016 05:03  WBC Latest Ref Range: 4.0 - 10.5 K/uL 11.7 (H)  RBC Latest Ref Range: 3.87 - 5.11 MIL/uL 3.85 (L)  Hemoglobin Latest Ref Range: 12.0 - 15.0 g/dL 10.4 (L)  HCT Latest Ref Range: 36.0 - 46.0 % 31.8 (L)  MCV Latest Ref Range: 78.0 - 100.0 fL 82.6  MCH Latest Ref Range: 26.0 - 34.0 pg 27.0  MCHC Latest Ref Range: 30.0 - 36.0 g/dL 32.7  RDW Latest Ref Range: 11.5 - 15.5 % 14.1  Platelets Latest Ref Range: 150 - 400 K/uL 306

## 2016-05-09 NOTE — Progress Notes (Signed)
Patient appears to be in better mood this am.  Talking, laughing, rating her pain 2 out of 10.  She has not taken any analgesia this night.  Stated she wants to go home.

## 2016-05-09 NOTE — Discharge Instructions (Signed)
No vaginal entry No operation of automobiles

## 2016-05-09 NOTE — Progress Notes (Signed)
Patient requested Tylenol for a pain score of 6-"little" cramping and aching.  She also requested Dulcolax suppository which was given.  Remains laughing and talking more this am then last night.

## 2016-05-09 NOTE — Discharge Summary (Signed)
Physician Discharge Summary  Patient ID: Gabriela Howard MRN: [id number] DOB/AGE: [birth year] 55 y.o.  Admit date: 05/07/2016 Discharge date: 05/09/2016  Admission Diagnoses:Postoperative fever  Discharge Diagnoses:  Active Problems:   Postoperative fever Cuff Cellulitis  Discharged Condition: good  Hospital Course: admitted for IV Zosyn, fluids. Had prompt response with good ambulation, good bladder function. Complains of some constipation. Had BM with rectal suppository. No N/V and tolerating regular diet well. Abdomen remains very soft and non-tender with good BS.   Consults: None  Significant Diagnostic Studies: labs:  Results for orders placed or performed during the hospital encounter of 05/07/16 (from the past 72 hour(s))  Urinalysis, Routine w reflex microscopic     Status: Abnormal   Collection Time: 05/07/16  6:57 PM  Result Value Ref Range   Color, Urine YELLOW YELLOW   APPearance CLEAR CLEAR   Specific Gravity, Urine 1.010 1.005 - 1.030   pH 6.0 5.0 - 8.0   Glucose, UA NEGATIVE NEGATIVE mg/dL   Hgb urine dipstick MODERATE (A) NEGATIVE   Bilirubin Urine NEGATIVE NEGATIVE   Ketones, ur 5 (A) NEGATIVE mg/dL   Protein, ur NEGATIVE NEGATIVE mg/dL   Nitrite NEGATIVE NEGATIVE   Leukocytes, UA TRACE (A) NEGATIVE   RBC / HPF 0-5 0 - 5 RBC/hpf   WBC, UA 6-30 0 - 5 WBC/hpf   Bacteria, UA NONE SEEN NONE SEEN   Squamous Epithelial / LPF 0-5 (A) NONE SEEN   Mucous PRESENT   CBC with Differential     Status: Abnormal   Collection Time: 05/07/16  7:23 PM  Result Value Ref Range   WBC 17.5 (H) 4.0 - 10.5 K/uL   RBC 4.14 3.87 - 5.11 MIL/uL   Hemoglobin 11.0 (L) 12.0 - 15.0 g/dL   HCT 33.6 (L) 36.0 - 46.0 %   MCV 81.2 78.0 - 100.0 fL   MCH 26.6 26.0 - 34.0 pg   MCHC 32.7 30.0 - 36.0 g/dL   RDW 14.0 11.5 - 15.5 %   Platelets 346 150 - 400 K/uL   Neutrophils Relative % 83 %   Neutro Abs 14.4 (H) 1.7 - 7.7 K/uL   Lymphocytes Relative 13 %   Lymphs Abs 2.3 0.7 - 4.0 K/uL    Monocytes Relative 4 %   Monocytes Absolute 0.7 0.1 - 1.0 K/uL   Eosinophils Relative 0 %   Eosinophils Absolute 0.1 0.0 - 0.7 K/uL   Basophils Relative 0 %   Basophils Absolute 0.0 0.0 - 0.1 K/uL  Influenza panel by PCR (type A & B, H1N1)     Status: None   Collection Time: 05/07/16  8:27 PM  Result Value Ref Range   Influenza A By PCR NEGATIVE NEGATIVE   Influenza B By PCR NEGATIVE NEGATIVE    Comment: (NOTE) The Xpert Xpress Flu assay is intended as an aid in the diagnosis of  influenza and should not be used as a sole basis for treatment.  This  assay is FDA approved for nasopharyngeal swab specimens only. Nasal  washings and aspirates are unacceptable for Xpert Xpress Flu testing.   CBC     Status: Abnormal   Collection Time: 05/08/16  5:41 AM  Result Value Ref Range   WBC 15.9 (H) 4.0 - 10.5 K/uL   RBC 3.84 (L) 3.87 - 5.11 MIL/uL   Hemoglobin 10.5 (L) 12.0 - 15.0 g/dL   HCT 31.4 (L) 36.0 - 46.0 %   MCV 81.8 78.0 - 100.0 fL   MCH 27.3  26.0 - 34.0 pg   MCHC 33.4 30.0 - 36.0 g/dL   RDW 14.2 11.5 - 15.5 %   Platelets 305 150 - 400 K/uL  Comprehensive metabolic panel     Status: Abnormal   Collection Time: 05/08/16  5:41 AM  Result Value Ref Range   Sodium 141 135 - 145 mmol/L   Potassium 4.2 3.5 - 5.1 mmol/L   Chloride 107 101 - 111 mmol/L   CO2 28 22 - 32 mmol/L   Glucose, Bld 102 (H) 65 - 99 mg/dL   BUN 9 6 - 20 mg/dL   Creatinine, Ser 0.71 0.44 - 1.00 mg/dL   Calcium 8.5 (L) 8.9 - 10.3 mg/dL   Total Protein 6.2 (L) 6.5 - 8.1 g/dL   Albumin 3.3 (L) 3.5 - 5.0 g/dL   AST 11 (L) 15 - 41 U/L   ALT 11 (L) 14 - 54 U/L   Alkaline Phosphatase 57 38 - 126 U/L   Total Bilirubin 0.5 0.3 - 1.2 mg/dL   GFR calc non Af Amer >60 >60 mL/min   GFR calc Af Amer >60 >60 mL/min    Comment: (NOTE) The eGFR has been calculated using the CKD EPI equation. This calculation has not been validated in all clinical situations. eGFR's persistently <60 mL/min signify possible Chronic  Kidney Disease.    Anion gap 6 5 - 15  CBC     Status: Abnormal   Collection Time: 05/09/16  5:03 AM  Result Value Ref Range   WBC 11.7 (H) 4.0 - 10.5 K/uL   RBC 3.85 (L) 3.87 - 5.11 MIL/uL   Hemoglobin 10.4 (L) 12.0 - 15.0 g/dL   HCT 31.8 (L) 36.0 - 46.0 %   MCV 82.6 78.0 - 100.0 fL   MCH 27.0 26.0 - 34.0 pg   MCHC 32.7 30.0 - 36.0 g/dL   RDW 14.1 11.5 - 15.5 %   Platelets 306 150 - 400 K/uL    Treatments: IV hydration and antibiotics: Zosyn  Discharge Exam: Blood pressure 100/62, pulse 82, temperature 98.8 F (37.1 C), temperature source Oral, resp. rate 14, height '5\' 3"'$  (1.6 m), weight 167 lb 0.9 oz (75.8 kg), last menstrual period 02/15/2011, SpO2 98 %. General appearance: alert, cooperative and no distress GI: soft, non-tender; bowel sounds normal; no masses,  no organomegaly  Disposition: 01-Home or Self Care   Allergies as of 05/09/2016      Reactions   Aleve [naproxen Sodium] Other (See Comments)   Reaction:  GI upset    Sulfa Antibiotics Other (See Comments)   Reaction:  Unknown       Medication List    STOP taking these medications   Turmeric 500 MG Caps     TAKE these medications   amoxicillin-clavulanate 875-125 MG tablet Commonly known as:  AUGMENTIN Take 1 tablet by mouth 2 (two) times daily.   cholecalciferol 1000 units tablet Commonly known as:  VITAMIN D Take 2,000 Units by mouth daily.   ibuprofen 600 MG tablet Commonly known as:  ADVIL,MOTRIN Take 1 tablet (600 mg total) by mouth every 6 (six) hours as needed for moderate pain.   magnesium hydroxide 400 MG/5ML suspension Commonly known as:  MILK OF MAGNESIA Take 30 mLs by mouth daily as needed for mild constipation.   multivitamin with minerals Tabs tablet Take 1 tablet by mouth daily.   NASACORT ALLERGY 24HR 55 MCG/ACT Aero nasal inhaler Generic drug:  triamcinolone Place 2 sprays into the nose at bedtime as needed (  for rhinitis).   oxyCODONE-acetaminophen 5-325 MG  tablet Commonly known as:  PERCOCET/ROXICET Take 1-2 tablets by mouth every 6 (six) hours as needed for severe pain.        Signed: Tymothy Cass II,Naryiah Schley E 05/09/2016, 7:57 AM

## 2016-05-09 NOTE — Progress Notes (Signed)
Teaching complete  Out ambulating well with husband

## 2016-06-08 ENCOUNTER — Encounter: Payer: Self-pay | Admitting: Emergency Medicine

## 2016-06-08 ENCOUNTER — Emergency Department
Admission: EM | Admit: 2016-06-08 | Discharge: 2016-06-08 | Disposition: A | Discharge status: Home / Self Care | Payer: BLUE CROSS/BLUE SHIELD | Source: Home / Self Care | Attending: Family Medicine | Admitting: Family Medicine

## 2016-06-08 DIAGNOSIS — J209 Acute bronchitis, unspecified: Secondary | ICD-10-CM

## 2016-06-08 MED ORDER — BENZONATATE 100 MG PO CAPS: 100.0000 mg | ORAL_CAPSULE | Freq: Three times a day (TID) | ORAL | 0 refills | Status: DC

## 2016-06-08 MED ORDER — AZITHROMYCIN 250 MG PO TABS: 250.0000 mg | ORAL_TABLET | Freq: Every day | ORAL | 0 refills | Status: DC

## 2016-06-08 NOTE — ED Provider Notes (Signed)
CSN: [id number]     Arrival date & time 06/08/16  1149 History   First MD Initiated Contact with Patient 06/08/16 1240     Chief Complaint  Patient presents with  . Cough   (Consider location/radiation/quality/duration/timing/severity/associated sxs/prior Treatment) HPI Gabriela Howard is a 56 y.o. female presenting to UC with c/o suddenly worsening hacking cough that has caused a few episodes of post-tussive vomiting over the last 2 days.  Symptoms initially started about 2 weeks ago with cold-like symptoms.  Her daughter has also been sick but not this bad.  Fever Tmax 102*F.  Denies nausea or diarrhea. Denies hx of asthma but has had bronchitis in the past.    Past Medical History:  Diagnosis Date  . Anxiety   . Depression   . Insomnia   . Uterine fibroid    Past Surgical History:  Procedure Laterality Date  . ABDOMINAL HYSTERECTOMY  05/03/2016   LAVH, total  . WRIST SURGERY     Family History  Problem Relation Age of Onset  . Thyroid disease Mother    Social History  Substance Use Topics  . Smoking status: Never Smoker  . Smokeless tobacco: Never Used  . Alcohol use Yes     Comment: occ   OB History    Gravida Para Term Preterm AB Living   3         3   SAB TAB Ectopic Multiple Live Births                 Review of Systems  Constitutional: Positive for fatigue and fever. Negative for chills.  HENT: Positive for congestion and rhinorrhea. Negative for ear pain, sore throat, trouble swallowing and voice change.   Respiratory: Positive for cough. Negative for shortness of breath.   Cardiovascular: Negative for chest pain and palpitations.  Gastrointestinal: Positive for vomiting ( post- tussive). Negative for abdominal pain, diarrhea and nausea.  Musculoskeletal: Negative for arthralgias, back pain and myalgias.  Skin: Negative for rash.  Neurological: Positive for headaches. Negative for dizziness and light-headedness.    Allergies  Aleve [naproxen sodium]  and Sulfa antibiotics  Home Medications   Prior to Admission medications   Medication Sig Start Date End Date Taking? Authorizing Provider  azithromycin (ZITHROMAX) 250 MG tablet Take 1 tablet (250 mg total) by mouth daily. Take first 2 tablets together, then 1 every day until finished. 06/08/16   Noland Fordyce, PA-C  benzonatate (TESSALON) 100 MG capsule Take 1-2 capsules (100-200 mg total) by mouth every 8 (eight) hours. 06/08/16   Noland Fordyce, PA-C  Multiple Vitamin (MULTIVITAMIN WITH MINERALS) TABS tablet Take 1 tablet by mouth daily.    Historical Provider, MD   Meds Ordered and Administered this Visit  Medications - No data to display  BP 117/84 (BP Location: Left Arm)   Pulse 116   Temp 100.1 F (37.8 C) (Oral)   Ht 5\' 3"  (1.6 m)   Wt 168 lb 12 oz (76.5 kg)   LMP 04/05/2016   SpO2 98%   BMI 29.89 kg/m  No data found.   Physical Exam  Constitutional: She appears well-developed and well-nourished. No distress.  HENT:  Head: Normocephalic and atraumatic.  Right Ear: Tympanic membrane normal.  Left Ear: Tympanic membrane normal.  Nose: Mucosal edema present. Right sinus exhibits no maxillary sinus tenderness and no frontal sinus tenderness. Left sinus exhibits no maxillary sinus tenderness and no frontal sinus tenderness.  Mouth/Throat: Uvula is midline, oropharynx is clear and moist  and mucous membranes are normal.  Eyes: Conjunctivae are normal. No scleral icterus.  Neck: Normal range of motion. Neck supple.  Cardiovascular: Normal rate, regular rhythm and normal heart sounds.   Pulmonary/Chest: Effort normal. No stridor. No respiratory distress. She has decreased breath sounds in the right lower field and the left lower field. She has no wheezes. She has rhonchi ( diffuse, clears with cough). She has no rales.  Dry hacking cough on exam. No respiratory distress.  Musculoskeletal: Normal range of motion.  Lymphadenopathy:    She has no cervical adenopathy.   Neurological: She is alert.  Skin: Skin is warm and dry. She is not diaphoretic.  Nursing note and vitals reviewed.   Urgent Care Course   Clinical Course     Procedures (including critical care time)  Labs Review Labs Reviewed - No data to display  Imaging Review No results found.    MDM   1. Acute bronchitis, unspecified organism    Hx and exam c/w acute bronchitis. Due to duration of symptoms and worsening symptoms, will cover for bacterial cause. Discussed getting CXR. Pt would like to try antibiotics first.   Rx: Azithromycin and tessalon  F/u with PCP in 1 week if not improving.     Noland Fordyce, PA-C 06/08/16 1253

## 2016-06-08 NOTE — ED Triage Notes (Signed)
Pt c/o cough x 2 days, fever, runny nose, chest hurts from coughing, vomitting last night and this morning.

## 2016-06-13 ENCOUNTER — Telehealth: Payer: Self-pay | Admitting: Emergency Medicine

## 2016-06-13 MED ORDER — ALBUTEROL SULFATE HFA 108 (90 BASE) MCG/ACT IN AERS: 1.0000 | INHALATION_SPRAY | Freq: Four times a day (QID) | RESPIRATORY_TRACT | 0 refills | Status: DC | PRN

## 2016-06-13 MED ORDER — PREDNISONE 20 MG PO TABS: ORAL_TABLET | ORAL | 0 refills | Status: DC

## 2016-06-13 MED ORDER — GUAIFENESIN 100 MG/5ML PO LIQD: 400.0000 mg | ORAL | 0 refills | Status: DC | PRN

## 2016-06-13 NOTE — Telephone Encounter (Signed)
Pt requesting something other than tessalon for her cough. She was on Azithromycin for bronchitis. Will send in prednisone and albuterol inhaler.  If symptoms not improving in 1 week, f/u with PCP.  If she has a fever, encouraged to come in with CXR.  If chest pain or difficulty breathing, encouraged pt to go to ED.

## 2016-06-13 NOTE — Telephone Encounter (Signed)
"  Pt requesting something other than tessalon for her cough. She was on Azithromycin for bronchitis. Will send in prednisone and albuterol inhaler.  If symptoms not improving in 1 week, f/u with PCP.  If she has a fever, encouraged to come in with CXR.  If chest pain or difficulty breathing, encouraged pt to go to ED." Martin Majestic over this message from [facility] with patient; she understands.

## 2017-02-23 ENCOUNTER — Ambulatory Visit (INDEPENDENT_AMBULATORY_CARE_PROVIDER_SITE_OTHER): Payer: BLUE CROSS/BLUE SHIELD | Admitting: Otolaryngology

## 2017-04-14 DIAGNOSIS — L309 Dermatitis, unspecified: Secondary | ICD-10-CM | POA: Diagnosis not present

## 2017-04-14 DIAGNOSIS — D2372 Other benign neoplasm of skin of left lower limb, including hip: Secondary | ICD-10-CM | POA: Diagnosis not present

## 2017-04-14 DIAGNOSIS — L259 Unspecified contact dermatitis, unspecified cause: Secondary | ICD-10-CM | POA: Diagnosis not present

## 2017-08-03 ENCOUNTER — Encounter: Payer: Self-pay | Admitting: Cardiovascular Disease

## 2017-08-03 ENCOUNTER — Other Ambulatory Visit (HOSPITAL_COMMUNITY): Payer: Self-pay | Admitting: Internal Medicine

## 2017-08-03 DIAGNOSIS — I6523 Occlusion and stenosis of bilateral carotid arteries: Secondary | ICD-10-CM

## 2017-08-03 DIAGNOSIS — Z0001 Encounter for general adult medical examination with abnormal findings: Secondary | ICD-10-CM | POA: Diagnosis not present

## 2017-08-03 DIAGNOSIS — Z8249 Family history of ischemic heart disease and other diseases of the circulatory system: Secondary | ICD-10-CM | POA: Diagnosis not present

## 2017-08-03 DIAGNOSIS — R42 Dizziness and giddiness: Secondary | ICD-10-CM | POA: Diagnosis not present

## 2017-08-03 DIAGNOSIS — R079 Chest pain, unspecified: Secondary | ICD-10-CM | POA: Diagnosis not present

## 2017-08-03 DIAGNOSIS — R51 Headache: Secondary | ICD-10-CM | POA: Diagnosis not present

## 2017-08-04 ENCOUNTER — Ambulatory Visit (HOSPITAL_COMMUNITY)
Admission: RE | Admit: 2017-08-04 | Discharge: 2017-08-04 | Disposition: A | Discharge status: Home / Self Care | Payer: BLUE CROSS/BLUE SHIELD | Source: Ambulatory Visit | Attending: Cardiovascular Disease | Admitting: Cardiovascular Disease

## 2017-08-04 DIAGNOSIS — R51 Headache: Secondary | ICD-10-CM | POA: Diagnosis not present

## 2017-08-04 DIAGNOSIS — R42 Dizziness and giddiness: Secondary | ICD-10-CM | POA: Insufficient documentation

## 2017-08-04 DIAGNOSIS — I6523 Occlusion and stenosis of bilateral carotid arteries: Secondary | ICD-10-CM | POA: Diagnosis not present

## 2017-08-07 ENCOUNTER — Encounter: Payer: Self-pay | Admitting: Cardiovascular Disease

## 2017-08-07 ENCOUNTER — Ambulatory Visit: Payer: BLUE CROSS/BLUE SHIELD | Admitting: Cardiovascular Disease

## 2017-08-07 VITALS — BP 128/72 | HR 79 | Ht 63.0 in | Wt 173.2 lb

## 2017-08-07 DIAGNOSIS — E669 Obesity, unspecified: Secondary | ICD-10-CM | POA: Diagnosis not present

## 2017-08-07 DIAGNOSIS — E785 Hyperlipidemia, unspecified: Secondary | ICD-10-CM | POA: Diagnosis not present

## 2017-08-07 DIAGNOSIS — Z79899 Other long term (current) drug therapy: Secondary | ICD-10-CM | POA: Diagnosis not present

## 2017-08-07 DIAGNOSIS — R0789 Other chest pain: Secondary | ICD-10-CM

## 2017-08-07 MED ORDER — ROSUVASTATIN CALCIUM 10 MG PO TABS: 10.0000 mg | ORAL_TABLET | Freq: Every day | ORAL | 3 refills | Status: DC

## 2017-08-07 NOTE — Patient Instructions (Signed)
Medication Instructions:  START rosuvastatin (Crestor) 10 mg daily  Labwork: Please return for FASTING labs in 3 months (CMET, Lipid)  Our in office lab hours are Monday-Friday 8:00-4:00, closed for lunch 12:45-1:45 pm.  No appointment needed.  Testing/Procedures: Your physician has requested that you have an exercise tolerance test. For further information please visit [email or web address]. Please also follow instruction sheet, as given.  Follow-Up: 3 months with Dr. Claiborne Billings  Any Other Special Instructions Will Be Listed Below (If Applicable).     If you need a refill on your cardiac medications before your next appointment, please call your pharmacy.

## 2017-08-07 NOTE — Progress Notes (Signed)
Cardiology Office Note    Date:  08/08/2017   ID:  [name], DOB 1961/04/14, MRN [id number]  PCP:  [name], MD  Cardiologist:  Shelva Majestic, MD   Chief Complaint  Patient presents with  . Chest Pain   New cardiology consultation referred through the courtesy of Dr. [name] for evaluation of chest pain.  History of Present Illness:  Gabriela Howard is a 57 y.o. female recently developed right sided chest and neck pain.  She is referred by Dr. Shelia Media for cardiology evaluation.  [name] denies any known prior cardiac history. She has had issues with headaches and some episodes of lightheadedness.  Recently, she has begun to notice some right-sided chest pain and right neck sensation. This typically can occur at any time and typically occurs while at rest.  It is relieved on its own.  She denies any exertional precipitation.  She denies any difficulty swallowing.  He has had some sinus issues and has had turbinate reduction.  Undergone carotid duplex imaging August 05, 2017 showed mild carotid plaque of 1-39% bilaterally.  Laboratory revealed an LDL cholesterol 153 which had increased from 121 in May 2018.  She was evaluated by Dr. [name] and is referred for cardiology evaluation.   Past Medical History:  Diagnosis Date  . Anxiety   . Depression   . Insomnia   . Uterine fibroid     Past Surgical History:  Procedure Laterality Date  . ABDOMINAL HYSTERECTOMY  05/03/2016   LAVH, total  . WRIST SURGERY      Current Medications: Outpatient Medications Prior to Visit  Medication Sig Dispense Refill  . Collagen Hydrolysate POWD by Does not apply route.    . Magnesium 500 MG CAPS Take 1 capsule by mouth daily.    . Multiple Vitamin (MULTIVITAMIN WITH MINERALS) TABS tablet Take 1 tablet by mouth daily.    Marland Kitchen zolpidem (AMBIEN) 5 MG tablet Take 5 mg by mouth at bedtime as needed for sleep.    Marland Kitchen albuterol (PROVENTIL HFA;VENTOLIN HFA) 108 (90 Base) MCG/ACT  inhaler Inhale 1-2 puffs into the lungs every 6 (six) hours as needed for wheezing or shortness of breath. 1 Inhaler 0  . azithromycin (ZITHROMAX) 250 MG tablet Take 1 tablet (250 mg total) by mouth daily. Take first 2 tablets together, then 1 every day until finished. 6 tablet 0  . benzonatate (TESSALON) 100 MG capsule Take 1-2 capsules (100-200 mg total) by mouth every 8 (eight) hours. 21 capsule 0  . guaiFENesin (ROBITUSSIN) 100 MG/5ML liquid Take 20 mLs (400 mg total) by mouth every 4 (four) hours as needed for cough. 118 mL 0  . predniSONE (DELTASONE) 20 MG tablet 3 tabs po day one, then 2 po daily x 4 days 11 tablet 0   No facility-administered medications prior to visit.      Allergies:   Aleve [naproxen sodium] and Sulfa antibiotics   Social History   Socioeconomic History  . Marital status: Married    Spouse name: None  . Number of children: None  . Years of education: None  . Highest education level: None  Social Needs  . Financial resource strain: None  . Food insecurity - worry: None  . Food insecurity - inability: None  . Transportation needs - medical: None  . Transportation needs - non-medical: None  Occupational History  . None  Tobacco Use  . Smoking status: Never Smoker  . Smokeless tobacco: Never Used  Substance and  Sexual Activity  . Alcohol use: Yes    Comment: occ  . Drug use: No  . Sexual activity: Not Currently    Birth control/protection: Surgical    Comment: LAVH  Other Topics Concern  . None  Social History Narrative  . None     Social history is notable she is originally from [place].  She is a Government social research officer for [employer].  She had smoked but quit in 1994.  No exercise.  Family History:  The patient's  family history includes Alzheimer's disease in her father; Heart disease in her father; High Cholesterol in her father; Hyperlipidemia in her brother; Hypertension in her mother; Thyroid disease in her mother.    ROS General: Negative; No fevers, chills, or night sweats;  HEENT:  sinus problems, status post turbinate reduction; No changes in vision or hearing, difficulty swallowing Pulmonary: Negative; No cough, wheezing, shortness of breath, hemoptysis Cardiovascular:  See HPI GI: Negative; No nausea, vomiting, diarrhea, or abdominal pain GU: Negative; No dysuria, hematuria, or difficulty voiding Musculoskeletal: Negative; no myalgias, joint pain, or weakness Hematologic/Oncology: Negative; no easy bruising, bleeding Endocrine: Negative; no heat/cold intolerance; no diabetes Neuro: Negative; no changes in balance, headaches Skin: Negative; No rashes or skin lesions Psychiatric: Negative; No behavioral problems, depression Sleep: Negative; No snoring, daytime sleepiness, hypersomnolence, bruxism, restless legs, hypnogognic hallucinations, no cataplexy Other comprehensive 14 point system review is negative.   PHYSICAL EXAM:   VS:  BP 128/72   Pulse 79   Ht _0  (1.6 m)   Wt 173 lb 3.2 oz (78.6 kg)   LMP 04/05/2016   SpO2 96%   BMI 30.68 kg/m     Blood pressure by me 122/70  Wt Readings from Last 3 Encounters:  08/07/17 173 lb 3.2 oz (78.6 kg)  06/08/16 168 lb 12 oz (76.5 kg)  05/07/16 167 lb 0.9 oz (75.8 kg)    General: Alert, oriented, no distress.  Skin: normal turgor, no rashes, warm and dry HEENT: Normocephalic, atraumatic. Pupils equal round and reactive to light; sclera anicteric; extraocular muscles intact; Fundi normal Nose without nasal septal hypertrophy Mouth/Parynx benign; Mallinpatti scale 3 Neck: No JVD, no carotid bruits; normal carotid upstroke Lungs: clear to ausculatation and percussion; no wheezing or rales Chest wall: without tenderness to palpitation Heart: PMI not displaced, RRR, s1 s2 normal, 1/6 systolic murmur, no diastolic murmur, no rubs, gallops, thrills, or heaves Abdomen: soft, nontender; no hepatosplenomehaly, BS+; abdominal aorta nontender and not  dilated by palpation. Back: no CVA tenderness Pulses 2+ Musculoskeletal: full range of motion, normal strength, no joint deformities Extremities: no clubbing cyanosis or edema, Homan's sign negative  Neurologic: grossly nonfocal; Cranial nerves grossly wnl Psychologic: Normal mood and affect   Studies/Labs Reviewed:   EKG: I personally reviewed the ECG from August 03, 2017 done at [facility]. Normal sinus rhythm.  No ST segment changes.  No ectopy.  Normal intervals.  Recent Labs: BMP Latest Ref Rng & Units 05/08/2016 02/27/2011  Glucose 65 - 99 mg/dL 102(H) 104(H)  BUN 6 - 20 mg/dL 9 16  Creatinine 0.44 - 1.00 mg/dL 0.71 0.60  Sodium 135 - 145 mmol/L 141 139  Potassium 3.5 - 5.1 mmol/L 4.2 5.1  Chloride 101 - 111 mmol/L 107 104  CO2 22 - 32 mmol/L 28 28  Calcium 8.9 - 10.3 mg/dL 8.5(L) 9.2     Hepatic Function Latest Ref Rng & Units 05/08/2016 02/27/2011  Total Protein 6.5 - 8.1 g/dL 6.2(L) 6.3  Albumin 3.5 - 5.0 g/dL 3.3(L) 3.7  AST 15 - 41 U/L 11(L) 13  ALT 14 - 54 U/L 11(L) 11  Alk Phosphatase 38 - 126 U/L 57 57  Total Bilirubin 0.3 - 1.2 mg/dL 0.5 0.2(L)    CBC Latest Ref Rng & Units 05/09/2016 05/08/2016 05/07/2016  WBC 4.0 - 10.5 K/uL 11.7(H) 15.9(H) 17.5(H)  Hemoglobin 12.0 - 15.0 g/dL 10.4(L) 10.5(L) 11.0(L)  Hematocrit 36.0 - 46.0 % 31.8(L) 31.4(L) 33.6(L)  Platelets 150 - 400 K/uL 306 305 346   Lab Results  Component Value Date   MCV 82.6 05/09/2016   MCV 81.8 05/08/2016   MCV 81.2 05/07/2016   No results found for: TSH No results found for: HGBA1C   BNP No results found for: BNP  ProBNP No results found for: PROBNP   Lipid Panel  No results found for: CHOL, TRIG, HDL, CHOLHDL, VLDL, LDLCALC, LDLDIRECT   RADIOLOGY: No results found.   Additional studies/ records that were reviewed today include:  From [facility] and recent laboratory.    ASSESSMENT:    1. Atypical chest pain   2. Hyperlipidemia,  unspecified hyperlipidemia type   3. Medication management   4. Obesity (BMI 30.0-34.9)      PLAN:  Ms Botkin is a pleasant 57 year old female recently developed nonexertional right sided upper chest sensation as well as a sensation in her right neck.  This clearly is not exertional.  Back to her chest pain most likely is musculoskeletal in etiology.  She denies any difficulty in swallowing.  ECG essentially is normal without ischemic changes.  I am recommending that she undergo a routine treadmill test for initial evaluation.  This hopefully will provide her with reassurance that this is not ischemic in etiology.  In addition, hopefully this can be used for exercise prescription since he is fairly sedentary and is planning now to start exercise.  Her lipid studies are abnormal and a recent lab just performed now shows an LDL cholesterol of 153 which has increased from 121.  I discussed with her data regarding subclinical atherosclerosis.  Her carotid study already demonstrates mild plaque in her carotids.  With her LDL of 153, she has a high likelihood of additional plaque elsewhere viewed with her recent study regarding subclinical atherosclerosis and otherwise normal patient's in a patient who had LDLs at 150, 64% of the patient's had development more often in multiple vascular territories.  For this reason, I have recommended initiation of low-dose statin therapy with Crestor 10 mg and hopefully induce plaque regression.  Base weight loss was recommended.  I discussed American Heart Association recommendations of exercising 5 days/week for a minimum of 30 minutes of moderate intensity. Laboratory will be obtained in 3 months following Crestor initiation and I will see her back for follow-up evaluation   Medication Adjustments/Labs and Tests Ordered: Current medicines are reviewed at length with the patient today.  Concerns regarding medicines are outlined above.  Medication changes, Labs and Tests  ordered today are listed in the Patient Instructions below. Patient Instructions  Medication Instructions:  START rosuvastatin (Crestor) 10 mg daily  Labwork: Please return for FASTING labs in 3 months (CMET, Lipid)  Our in office lab hours are Monday-Friday 8:00-4:00, closed for lunch 12:45-1:45 pm.  No appointment needed.  Testing/Procedures: Your physician has requested that you have an exercise tolerance test. For further information please visit [email or web address]. Please also follow instruction sheet, as given.  Follow-Up: 3 months with Dr. Claiborne Billings  Any Other  Special Instructions Will Be Listed Below (If Applicable).     If you need a refill on your cardiac medications before your next appointment, please call your pharmacy.      Signed, Shelva Majestic, MD  08/08/2017 11:25 AM    [facility] [address], [place], [place], [place]  [address] Phone: [phone]

## 2017-08-08 ENCOUNTER — Encounter: Payer: Self-pay | Admitting: Cardiovascular Disease

## 2017-08-11 ENCOUNTER — Telehealth (HOSPITAL_COMMUNITY): Payer: Self-pay

## 2017-08-11 NOTE — Telephone Encounter (Signed)
Encounter complete. 

## 2017-08-12 ENCOUNTER — Ambulatory Visit (HOSPITAL_COMMUNITY)
Admission: RE | Admit: 2017-08-12 | Discharge: 2017-08-12 | Disposition: A | Discharge status: Home / Self Care | Payer: BLUE CROSS/BLUE SHIELD | Source: Ambulatory Visit | Attending: Cardiovascular Disease | Admitting: Cardiovascular Disease

## 2017-08-12 DIAGNOSIS — R0789 Other chest pain: Secondary | ICD-10-CM | POA: Diagnosis not present

## 2017-08-13 LAB — EXERCISE TOLERANCE TEST
CHL RATE OF PERCEIVED EXERTION: 17
CSEPED: 9 min
CSEPEDS: 0 s
CSEPEW: 10.1 METS
CSEPPHR: 153 {beats}/min
MPHR: 164 {beats}/min
Percent HR: 93 %
Rest HR: 72 {beats}/min

## 2017-08-17 ENCOUNTER — Telehealth: Payer: Self-pay | Admitting: Cardiovascular Disease

## 2017-08-17 NOTE — Telephone Encounter (Signed)
New Message    Patient calling for her exercise tolerance test

## 2017-08-17 NOTE — Telephone Encounter (Signed)
.  Exaggerated blood pressure may be indicative of labile HTN; May need mild RX. Arrange for sooner f/u.

## 2017-08-17 NOTE — Telephone Encounter (Signed)
Returned the call to the patient for her results. She was concerned about how high her blood pressure went when she did the test. Per the results it was 201/103. Message has been routed to the provider for his knowledge.   Notes recorded by Troy Sine, MD on 08/15/2017 at 11:33 PM EDT Negative for ischemia. Graded exercise treadmill test.

## 2017-08-18 NOTE — Telephone Encounter (Signed)
Patient has been made aware that scheduling will be in touch to set up a sooner appointment.

## 2017-08-19 ENCOUNTER — Telehealth: Payer: Self-pay | Admitting: Cardiovascular Disease

## 2017-08-19 NOTE — Telephone Encounter (Signed)
LMTCB

## 2017-08-19 NOTE — Telephone Encounter (Signed)
New Message    Patient is calling back in reference to the conversation she had yesterday about BP. She states that she was to get a call back about her additional questions. Yet she did not tell me what the additional questions where. Just wanted a call back. Please call to discuss.

## 2017-08-19 NOTE — Telephone Encounter (Signed)
Returned call to patient who is scheduled to see MD on 4/8. She is wanting to know if she has exercise restrictions based on stress test results/BP. Advised that there were NO restrictions noted on test results concerning exercise restrictions. Any medication management of BP will be discussed at office visit. She voiced understanding.

## 2017-08-19 NOTE — Telephone Encounter (Signed)
Follow up    Patient is returning [place] call

## 2017-08-31 ENCOUNTER — Encounter: Payer: Self-pay | Admitting: Cardiovascular Disease

## 2017-08-31 ENCOUNTER — Ambulatory Visit: Payer: BLUE CROSS/BLUE SHIELD | Admitting: Cardiovascular Disease

## 2017-08-31 VITALS — BP 127/89 | HR 77 | Ht 63.0 in | Wt 173.0 lb

## 2017-08-31 DIAGNOSIS — E785 Hyperlipidemia, unspecified: Secondary | ICD-10-CM | POA: Diagnosis not present

## 2017-08-31 DIAGNOSIS — R0989 Other specified symptoms and signs involving the circulatory and respiratory systems: Secondary | ICD-10-CM | POA: Diagnosis not present

## 2017-08-31 DIAGNOSIS — I6523 Occlusion and stenosis of bilateral carotid arteries: Secondary | ICD-10-CM | POA: Diagnosis not present

## 2017-08-31 DIAGNOSIS — E669 Obesity, unspecified: Secondary | ICD-10-CM

## 2017-08-31 DIAGNOSIS — R0789 Other chest pain: Secondary | ICD-10-CM

## 2017-08-31 MED ORDER — NEBIVOLOL HCL 5 MG PO TABS: 5.0000 mg | ORAL_TABLET | Freq: Every day | ORAL | 3 refills | Status: DC

## 2017-08-31 NOTE — Patient Instructions (Signed)
Medication Instructions:  START Bystolic 5 mg --take 2.5 mg daily for the first 1-2 weeks  Follow-Up: 4 months with Dr. Claiborne Billings  Any Other Special Instructions Will Be Listed Below (If Applicable).     If you need a refill on your cardiac medications before your next appointment, please call your pharmacy.

## 2017-08-31 NOTE — Progress Notes (Signed)
Cardiology Office Note    Date:  09/02/2017   ID:  Gabriela Howard, DOB 10-05-1960, MRN [id number]  PCP:  Gabriela Pretty, MD  Cardiologist:  Gabriela Majestic, MD   No chief complaint on file.  F/U cardiology evaluation initially referred through the courtesy of Dr. Deland Howard for evaluation of chest pain.  History of Present Illness:  Gabriela Howard is a 57 y.o. female recently developed right sided chest and neck pain.  She was referred by Dr. Shelia Howard for cardiology evaluation and I initially saw her on 08/07/2017.  She presents now for 1 month follow-up evaluation.  Ms Gabriela Howard denies any known prior cardiac history. She has had issues with headaches and some episodes of lightheadedness.  Recently, she has begun to notice some right-sided chest pain and right neck sensation. This typically can occur at any time and typically occurs while at rest.  It is relieved on its own.  She denied any exertional precipitation.  She denies any difficulty swallowing.  He has had some sinus issues and has had turbinate reduction. Carotid duplex imaging August 05, 2017 showed mild carotid plaque of 1-39% bilaterally.  Laboratory revealed an LDL cholesterol 153 which had increased from 121 in May 2018.    When I initially saw her, I felt her chest pain was most likely musculoskeletal in etiology.  She did not have any exertional symptoms.  Her ECG was normal.  I referred her to undergo a routine treadmill test which was done on 08/12/2017.  She did not develop any ST segment changes during exercise.  She exercised for 9 minutes and a 10.1 metworkload.  Her peak blood pressure was exaggerated where she states that she saw soon as the test ended her systolic blood pressure had risen up to 220.  Elevate his mercury, although the peak BP reported in the note was 201/103.  1.  I also saw her, I was concerned about her LDL cholesterol, which had risen to 153, and I recommended the addition of Crestor 10 mg, particularly  with her documentation of subclinical atherosclerosis which already had been demonstrated to be in her carotids.  She has tolerated the Crestor so far without side effects.  She is not yet had repeat labs.  She was very concerned about her exaggerated blood pressure during exercise and presents for follow-up evaluation.  Past Medical History:  Diagnosis Date  . Anxiety   . Depression   . Insomnia   . Uterine fibroid     Past Surgical History:  Procedure Laterality Date  . ABDOMINAL HYSTERECTOMY  05/03/2016   LAVH, total  . WRIST SURGERY      Current Medications: Outpatient Medications Prior to Visit  Medication Sig Dispense Refill  . Ascorbic Acid (VITAMIN C) 1000 MG tablet Take 1,000 mg by mouth daily.    . Cholecalciferol (VITAMIN D) 2000 units CAPS Take 1 capsule by mouth daily.    . Collagen Hydrolysate POWD by Does not apply route.    . Magnesium 500 MG CAPS Take 1 capsule by mouth daily.    . Multiple Vitamin (MULTIVITAMIN WITH MINERALS) TABS tablet Take 1 tablet by mouth daily.    . rosuvastatin (CRESTOR) 10 MG tablet Take 1 tablet (10 mg total) by mouth daily. 90 tablet 3  . zolpidem (AMBIEN) 5 MG tablet Take 5 mg by mouth at bedtime as needed for sleep.     No facility-administered medications prior to visit.      Allergies:  Aleve [naproxen sodium] and Sulfa antibiotics   Social History   Socioeconomic History  . Marital status: Married    Spouse name: Not on file  . Number of children: Not on file  . Years of education: Not on file  . Highest education level: Not on file  Occupational History  . Not on file  Social Needs  . Financial resource strain: Not on file  . Food insecurity:    Worry: Not on file    Inability: Not on file  . Transportation needs:    Medical: Not on file    Non-medical: Not on file  Tobacco Use  . Smoking status: Never Smoker  . Smokeless tobacco: Never Used  Substance and Sexual Activity  . Alcohol use: Yes    Comment: occ    . Drug use: No  . Sexual activity: Not Currently    Birth control/protection: Surgical    Comment: LAVH  Lifestyle  . Physical activity:    Days per week: Not on file    Minutes per session: Not on file  . Stress: Not on file  Relationships  . Social connections:    Talks on phone: Not on file    Gets together: Not on file    Attends religious service: Not on file    Active member of club or organization: Not on file    Attends meetings of clubs or organizations: Not on file    Relationship status: Not on file  Other Topics Concern  . Not on file  Social History Narrative  . Not on file     Social history is notable she is originally from [place].  She is a Government social research officer for [employer].  She had smoked but quit in 1994.  No exercise.  Family History:  The patient's  family history includes Alzheimer's disease in her father; Heart disease in her father; High Cholesterol in her father; Hyperlipidemia in her brother; Hypertension in her mother; Thyroid disease in her mother.   ROS General: Negative; No fevers, chills, or night sweats;  HEENT:  sinus problems, status post turbinate reduction; No changes in vision or hearing, difficulty swallowing Pulmonary: Negative; No cough, wheezing, shortness of breath, hemoptysis Cardiovascular:  See HPI GI: Negative; No nausea, vomiting, diarrhea, or abdominal pain GU: Negative; No dysuria, hematuria, or difficulty voiding Musculoskeletal: Negative; no myalgias, joint pain, or weakness Hematologic/Oncology: Negative; no easy bruising, bleeding Endocrine: Negative; no heat/cold intolerance; no diabetes Neuro: Negative; no changes in balance, headaches Skin: Negative; No rashes or skin lesions Psychiatric: Negative; No behavioral problems, depression Sleep: Negative; No snoring, daytime sleepiness, hypersomnolence, bruxism, restless legs, hypnogognic hallucinations, no cataplexy Other comprehensive 14 point system review  is negative.   PHYSICAL EXAM:   VS:  BP 127/89   Pulse 77   Ht _0  (1.6 m)   Wt 173 lb (78.5 kg)   LMP 04/05/2016   SpO2 97%   BMI 30.65 kg/m     Repeat blood pressure by me today was 122/78  Wt Readings from Last 3 Encounters:  08/31/17 173 lb (78.5 kg)  08/07/17 173 lb 3.2 oz (78.6 kg)  06/08/16 168 lb 12 oz (76.5 kg)    General: Alert, oriented, no distress.  Skin: normal turgor, no rashes, warm and dry HEENT: Normocephalic, atraumatic. Pupils equal round and reactive to light; sclera anicteric; extraocular muscles intact;  Nose without nasal septal hypertrophy Mouth/Parynx benign; Mallinpatti scale 3 Neck: No JVD, no carotid bruits; normal carotid  upstroke Lungs: clear to ausculatation and percussion; no wheezing or rales Chest wall: without tenderness to palpitation Heart: PMI not displaced, RRR, s1 s2 normal, 1/6 systolic murmur, no diastolic murmur, no rubs, gallops, thrills, or heaves Abdomen: soft, nontender; no hepatosplenomehaly, BS+; abdominal aorta nontender and not dilated by palpation. Back: no CVA tenderness Pulses 2+ Musculoskeletal: full range of motion, normal strength, no joint deformities Extremities: no clubbing cyanosis or edema, Homan's sign negative  Neurologic: grossly nonfocal; Cranial nerves grossly wnl Psychologic: Normal mood and affect   Studies/Labs Reviewed:   EKG: I personally reviewed the ECG from August 03, 2017 done at [facility]. Normal sinus rhythm.  No ST segment changes.  No ectopy.  Normal intervals.  Recent Labs: BMP Latest Ref Rng & Units 05/08/2016 02/27/2011  Glucose 65 - 99 mg/dL 102(H) 104(H)  BUN 6 - 20 mg/dL 9 16  Creatinine 0.44 - 1.00 mg/dL 0.71 0.60  Sodium 135 - 145 mmol/L 141 139  Potassium 3.5 - 5.1 mmol/L 4.2 5.1  Chloride 101 - 111 mmol/L 107 104  CO2 22 - 32 mmol/L 28 28  Calcium 8.9 - 10.3 mg/dL 8.5(L) 9.2     Hepatic Function Latest Ref Rng & Units 05/08/2016 02/27/2011  Total  Protein 6.5 - 8.1 g/dL 6.2(L) 6.3  Albumin 3.5 - 5.0 g/dL 3.3(L) 3.7  AST 15 - 41 U/L 11(L) 13  ALT 14 - 54 U/L 11(L) 11  Alk Phosphatase 38 - 126 U/L 57 57  Total Bilirubin 0.3 - 1.2 mg/dL 0.5 0.2(L)    CBC Latest Ref Rng & Units 05/09/2016 05/08/2016 05/07/2016  WBC 4.0 - 10.5 K/uL 11.7(H) 15.9(H) 17.5(H)  Hemoglobin 12.0 - 15.0 g/dL 10.4(L) 10.5(L) 11.0(L)  Hematocrit 36.0 - 46.0 % 31.8(L) 31.4(L) 33.6(L)  Platelets 150 - 400 K/uL 306 305 346   Lab Results  Component Value Date   MCV 82.6 05/09/2016   MCV 81.8 05/08/2016   MCV 81.2 05/07/2016   No results found for: TSH No results found for: HGBA1C   BNP No results found for: BNP  ProBNP No results found for: PROBNP   Lipid Panel  No results found for: CHOL, TRIG, HDL, CHOLHDL, VLDL, LDLCALC, LDLDIRECT   RADIOLOGY: No results found.   Additional studies/ records that were reviewed today include:  From [facility] and recent laboratory.    ASSESSMENT:    1. Atypical chest pain   2. Labile hypertension with exercise   3. Bilateral carotid artery stenosis   4. Hyperlipidemia with target LDL less than 70   5. Obesity (BMI 30.0-34.9)     PLAN:  Ms Wallander is a pleasant 57 year old female recently developed nonexertional right sided upper chest sensation as well as a sensation in her right neck.  I did not feel her chest pain was ischemic etiology.  She underwent a routine treadmill test in which she exercised without chest pain development nor had ECG changes.  She exercised to a 10.1 met workload and completed 9 minutes of exercise.  She did have an exaggerated blood pressure response to exercise, which is reflective of probable labile hypertension.  At times, she also admits to experiencing rare episodes of chest fluttering.  As result, I'm electing to use start her on low-dose Bystolic and have provided her with samples of a 5 mg pill and she will initiate at 2.5 mg.  Should be helpful to reduce  her exaggerated blood pressure with exercise as well as sensation of chest fluttering.  She  will monitor her heart rate and blood pressure and if her heart rate stays in the 70s.  Her higher.  I have suggested titrating to 5 mg.  She is tolerating rosuvastatin 10 mg for hyperlipidemia.  Repeat laboratory will need to be done in several months.  In light of hers previous documentation of subclinical atherosclerosis with carotid plaque, I have recommended a target LDL of less than 70.  She is mildly obese with a BMI of 30.65, and increased exercise and weight loss was recommended.  I will see her in 4 months for reevaluation.   Medication Adjustments/Labs and Tests Ordered: Current medicines are reviewed at length with the patient today.  Concerns regarding medicines are outlined above.  Medication changes, Labs and Tests ordered today are listed in the Patient Instructions below. Patient Instructions  Medication Instructions:  START Bystolic 5 mg --take 2.5 mg daily for the first 1-2 weeks  Follow-Up: 4 months with Dr. Claiborne Billings  Any Other Special Instructions Will Be Listed Below (If Applicable).     If you need a refill on your cardiac medications before your next appointment, please call your pharmacy.      Signed, Gabriela Majestic, MD  09/02/2017 8:20 PM    [facility] [address] [place], [place], [place]  [address] Phone: [phone]

## 2017-09-02 ENCOUNTER — Encounter: Payer: Self-pay | Admitting: Cardiovascular Disease

## 2017-09-28 DIAGNOSIS — R51 Headache: Secondary | ICD-10-CM | POA: Diagnosis not present

## 2017-09-28 DIAGNOSIS — I6523 Occlusion and stenosis of bilateral carotid arteries: Secondary | ICD-10-CM | POA: Diagnosis not present

## 2017-09-28 DIAGNOSIS — G4700 Insomnia, unspecified: Secondary | ICD-10-CM | POA: Diagnosis not present

## 2017-09-28 DIAGNOSIS — R42 Dizziness and giddiness: Secondary | ICD-10-CM | POA: Diagnosis not present

## 2017-11-17 ENCOUNTER — Ambulatory Visit: Payer: BLUE CROSS/BLUE SHIELD | Admitting: Cardiovascular Disease

## 2017-12-02 DIAGNOSIS — Z6830 Body mass index (BMI) 30.0-30.9, adult: Secondary | ICD-10-CM | POA: Diagnosis not present

## 2017-12-02 DIAGNOSIS — Z01419 Encounter for gynecological examination (general) (routine) without abnormal findings: Secondary | ICD-10-CM | POA: Diagnosis not present

## 2017-12-02 DIAGNOSIS — Z1231 Encounter for screening mammogram for malignant neoplasm of breast: Secondary | ICD-10-CM | POA: Diagnosis not present

## 2017-12-08 ENCOUNTER — Telehealth: Payer: Self-pay | Admitting: Cardiovascular Disease

## 2017-12-08 DIAGNOSIS — M79604 Pain in right leg: Secondary | ICD-10-CM

## 2017-12-08 DIAGNOSIS — M79605 Pain in left leg: Principal | ICD-10-CM

## 2017-12-08 NOTE — Telephone Encounter (Signed)
New Message:   Pt having real bad pain in her legs, especially the right leg. Sometimes pt says she can not walk on them, wonder if it might be a blood clot.

## 2017-12-08 NOTE — Telephone Encounter (Addendum)
Returned call to patient of Dr. Claiborne Billings. She reports bilateral leg pain for 3 weeks. She states the pain comes on out of the blue. She states she cannot walk when she has the pain. She states it is worse in the right leg. She describes the pain as starting in her knee and shooting down her leg. She also has had pain in her groin area. She cannot correlate the symptoms to any triggers. When suggested that the pain sounds like nerve pain, she states she believes it is not this. She is asking if Dr. Claiborne Billings would refer her to vascular surgeon.  Advised would notify MD to review

## 2017-12-08 NOTE — Telephone Encounter (Signed)
I am not certain that her discomfort is of vascular etiology.  It does not sound like classic claudication.  Does she note any swelling?  Question orthopedic versus neurologic discomfort.  If she wants to see a vascular surgeon then refer to [facility].

## 2017-12-10 NOTE — Telephone Encounter (Signed)
Returned call to patient. She reports a 2in spot on her leg that was swelling in the leg that was hurting. This has since resolved. Explained MD interpretation of her symptoms. She does wish to proceed with vascular surgeon referral - this has been ordered.

## 2017-12-11 ENCOUNTER — Other Ambulatory Visit: Payer: Self-pay

## 2017-12-11 DIAGNOSIS — M79604 Pain in right leg: Secondary | ICD-10-CM

## 2017-12-11 DIAGNOSIS — M79605 Pain in left leg: Principal | ICD-10-CM

## 2018-01-20 ENCOUNTER — Ambulatory Visit: Payer: BLUE CROSS/BLUE SHIELD | Admitting: Cardiovascular Disease

## 2018-01-21 ENCOUNTER — Encounter: Payer: Self-pay | Admitting: *Deleted

## 2018-01-30 ENCOUNTER — Other Ambulatory Visit: Payer: Self-pay

## 2018-01-30 ENCOUNTER — Encounter: Payer: Self-pay | Admitting: Emergency Medicine

## 2018-01-30 ENCOUNTER — Emergency Department (INDEPENDENT_AMBULATORY_CARE_PROVIDER_SITE_OTHER): Payer: BLUE CROSS/BLUE SHIELD

## 2018-01-30 ENCOUNTER — Emergency Department (INDEPENDENT_AMBULATORY_CARE_PROVIDER_SITE_OTHER)
Admission: EM | Admit: 2018-01-30 | Discharge: 2018-01-30 | Disposition: A | Discharge status: Home / Self Care | Payer: BLUE CROSS/BLUE SHIELD | Source: Home / Self Care | Attending: Family Medicine | Admitting: Family Medicine

## 2018-01-30 DIAGNOSIS — J3489 Other specified disorders of nose and nasal sinuses: Secondary | ICD-10-CM

## 2018-01-30 DIAGNOSIS — S0990XA Unspecified injury of head, initial encounter: Secondary | ICD-10-CM

## 2018-01-30 DIAGNOSIS — S060X1A Concussion with loss of consciousness of 30 minutes or less, initial encounter: Secondary | ICD-10-CM | POA: Diagnosis not present

## 2018-01-30 DIAGNOSIS — S0083XA Contusion of other part of head, initial encounter: Secondary | ICD-10-CM

## 2018-01-30 DIAGNOSIS — S0992XA Unspecified injury of nose, initial encounter: Secondary | ICD-10-CM | POA: Diagnosis not present

## 2018-01-30 MED ORDER — ONDANSETRON HCL 4 MG PO TABS: 4.0000 mg | ORAL_TABLET | Freq: Four times a day (QID) | ORAL | 0 refills | Status: DC

## 2018-01-30 NOTE — ED Triage Notes (Signed)
Pt c/o headache that began after injury. States she was reaching for something on the top shelf when a hetal storage lid came down and hit her face on the right side. She states she did have LOC for 5-10 seconds.

## 2018-01-30 NOTE — ED Provider Notes (Signed)
[name]    CSN: [id number] Arrival date & time: 01/30/18  1204     History   Chief Complaint Chief Complaint  Patient presents with  . Headache    HPI Gabriela Howard is a 57 y.o. female.   HPI  Gabriela Howard is a 57 y.o. female presenting to UC with c/o generalized HA that started 5 days ago after being hit in the face with a metal storage lid. She reports LOC for possibly 5-10 seconds. She recalls getting hit unexpectedly in the face after reaching for something on the top shelf then recalls sitting on a step.  She has taken Tylenol and Aleve with mild relief. Associated nausea but no vomiting or change in vision.  Pain is aching and sore, worse around Right eyebrow and bridge of nose. She is not on blood thinners. Denies neck pain. Denies numbness or tingling in arms or legs.    Past Medical History:  Diagnosis Date  . Anxiety   . Depression   . Insomnia   . Uterine fibroid     Patient Active Problem List   Diagnosis Date Noted  . Postoperative fever 05/07/2016    Past Surgical History:  Procedure Laterality Date  . ABDOMINAL HYSTERECTOMY  05/03/2016   LAVH, total  . WRIST SURGERY      OB History    Gravida  3   Para      Term      Preterm      AB      Living  3     SAB      TAB      Ectopic      Multiple      Live Births               Home Medications    Prior to Admission medications   Medication Sig Start Date End Date Taking? Authorizing Provider  Ascorbic Acid (VITAMIN C) 1000 MG tablet Take 1,000 mg by mouth daily.    [provider]  Cholecalciferol (VITAMIN D) 2000 units CAPS Take 1 capsule by mouth daily.    [provider]  Collagen Hydrolysate POWD by Does not apply route.    [provider]  Magnesium 500 MG CAPS Take 1 capsule by mouth daily.    [provider]  Multiple Vitamin (MULTIVITAMIN WITH MINERALS) TABS tablet Take 1 tablet by mouth daily.    [provider]  nebivolol (BYSTOLIC) 5 MG tablet Take 1 tablet (5 mg total) by mouth daily. 08/31/17   Troy Sine, MD  ondansetron (ZOFRAN) 4 MG tablet Take 1 tablet (4 mg total) by mouth every 6 (six) hours. 01/30/18   Noe Gens, PA-C  rosuvastatin (CRESTOR) 10 MG tablet Take 1 tablet (10 mg total) by mouth daily. 08/07/17 11/05/17  Troy Sine, MD  zolpidem (AMBIEN) 5 MG tablet Take 5 mg by mouth at bedtime as needed for sleep.    [provider]    Family History Family History  Problem Relation Age of Onset  . Thyroid disease Mother   . Hypertension Mother   . Heart disease Father   . Alzheimer's disease Father   . High Cholesterol Father   . Hyperlipidemia Brother     Social History Social History   Tobacco Use  . Smoking status: Never Smoker  . Smokeless tobacco: Never Used  Substance Use Topics  . Alcohol use: Yes    Comment: occ  .  Drug use: No     Allergies   Aleve [naproxen sodium] and Sulfa antibiotics   Review of Systems Review of Systems  Eyes: Negative for photophobia, pain and visual disturbance.  Gastrointestinal: Positive for nausea. Negative for vomiting.  Musculoskeletal: Negative for neck pain and neck stiffness.  Skin: Positive for color change and wound.  Neurological: Positive for headaches. Negative for dizziness and light-headedness.     Physical Exam Triage Vital Signs ED Triage Vitals [01/30/18 1218]  Enc Vitals Group     BP 129/87     Pulse Rate 80     Resp      Temp 98.5 F (36.9 C)     Temp Source Oral     SpO2 99 %     Weight 172 lb (78 kg)     Height 5\' 2"  (1.575 m)     Head Circumference      Peak Flow      Pain Score 3     Pain Loc      Pain Edu?      Excl. in [place]?    No data found.  Updated Vital Signs BP 129/87 (BP Location: Right Arm)   Pulse 80   Temp 98.5 F (36.9 C) (Oral)   Ht 5\' 2"  (1.575 m)   Wt 172 lb (78 kg)   LMP 04/05/2016   SpO2 99%   BMI 31.46 kg/m   Visual Acuity Right Eye  Distance: 20/20 Left Eye Distance: 20/15 Bilateral Distance: 20/15  Right Eye Near:   Left Eye Near:    Bilateral Near:     Physical Exam  Constitutional: She is oriented to person, place, and time. She appears well-developed and well-nourished.  HENT:  Head: Normocephalic. Head is with abrasion and with contusion.    Right Ear: Tympanic membrane normal.  Left Ear: Tympanic membrane normal.  Nose: Sinus tenderness present. No nasal deformity, septal deviation or nasal septal hematoma. No epistaxis. Right sinus exhibits maxillary sinus tenderness and frontal sinus tenderness. Left sinus exhibits no maxillary sinus tenderness and no frontal sinus tenderness.  Mouth/Throat: Uvula is midline, oropharynx is clear and moist and mucous membranes are normal. No trismus in the jaw.  Small abrasion to Right eyebrow and bridge of nose. No bleeding or erythema.  Faint ecchymosis to Right eyebrow. Tender. No obvious deformity. No crepitus.   Eyes: Pupils are equal, round, and reactive to light. EOM are normal.  Neck: Normal range of motion. Neck supple.  No midline bone tenderness, no crepitus or step-offs.   Cardiovascular: Normal rate and regular rhythm.  Pulmonary/Chest: Effort normal and breath sounds normal. No respiratory distress.  Musculoskeletal: Normal range of motion.  Neurological: She is alert and oriented to person, place, and time. She has normal strength. She displays a negative Romberg sign. Coordination and gait normal. GCS eye subscore is 4. GCS verbal subscore is 5. GCS motor subscore is 6.  Skin: Skin is warm and dry.  Psychiatric: She has a normal mood and affect. Her behavior is normal.  Nursing note and vitals reviewed.    UC Treatments / Results  Labs (all labs ordered are listed, but only abnormal results are displayed) Labs Reviewed - No data to display  EKG None  Radiology Dg Nasal Bones  Result Date: 01/30/2018 CLINICAL DATA:  Pain after trauma. EXAM: NASAL  BONES - 3+ VIEW COMPARISON:  None. FINDINGS: No fractures noted. IMPRESSION: No identified fractures.  CT imaging would be more sensitive. Electronically Signed  By: Dorise Bullion III M.D   On: 01/30/2018 13:25    Procedures Procedures (including critical care time)  Medications Ordered in UC Medications - No data to display  Initial Impression / Assessment and Plan / UC Course  I have reviewed the triage vital signs and the nursing notes.  Pertinent labs & imaging results that were available during my care of the patient were reviewed by me and considered in my medical decision making (see chart for details).     Pt is 5 days post head injury. Normal neuro exam No facial fractures noted on imaging Hx and exam c/w concussion No evidence of emergent process taking place Home care packet provided. Discussed symptoms that warrant emergent care in the ED.  Final Clinical Impressions(s) / UC Diagnoses   Final diagnoses:  Injury of head, initial encounter  Concussion with loss of consciousness of 30 minutes or less, initial encounter  Facial contusion, initial encounter     Discharge Instructions      Please follow up with family medicine or sports medicine late this week if symptoms not improving.  Please call 911 or go to the hospital if symptoms worsening.     ED Prescriptions    Medication Sig Dispense Auth. Provider   ondansetron (ZOFRAN) 4 MG tablet Take 1 tablet (4 mg total) by mouth every 6 (six) hours. 12 tablet Noe Gens, PA-C     Controlled Substance Prescriptions [place] Controlled Substance Registry consulted? Not Applicable   Tyrell Antonio 01/30/18 1559

## 2018-01-30 NOTE — Discharge Instructions (Signed)
°  Please follow up with family medicine or sports medicine late this week if symptoms not improving.  Please call 911 or go to the hospital if symptoms worsening.

## 2018-02-05 ENCOUNTER — Encounter: Payer: Self-pay | Admitting: Vascular Surgery

## 2018-02-05 ENCOUNTER — Ambulatory Visit (HOSPITAL_COMMUNITY): Payer: BLUE CROSS/BLUE SHIELD | Attending: Vascular Surgery

## 2018-02-05 ENCOUNTER — Encounter: Payer: BLUE CROSS/BLUE SHIELD | Admitting: Vascular Surgery

## 2018-02-24 ENCOUNTER — Other Ambulatory Visit: Payer: Self-pay | Admitting: Family Medicine

## 2018-02-24 ENCOUNTER — Ambulatory Visit
Admission: RE | Admit: 2018-02-24 | Discharge: 2018-02-24 | Disposition: A | Discharge status: Home / Self Care | Payer: BLUE CROSS/BLUE SHIELD | Source: Ambulatory Visit | Attending: Family Medicine | Admitting: Family Medicine

## 2018-02-24 DIAGNOSIS — S0990XA Unspecified injury of head, initial encounter: Secondary | ICD-10-CM

## 2018-02-24 DIAGNOSIS — R51 Headache: Secondary | ICD-10-CM | POA: Diagnosis not present

## 2018-06-30 DIAGNOSIS — I7090 Unspecified atherosclerosis: Secondary | ICD-10-CM | POA: Diagnosis not present

## 2018-06-30 DIAGNOSIS — M79605 Pain in left leg: Secondary | ICD-10-CM | POA: Diagnosis not present

## 2018-06-30 DIAGNOSIS — G2581 Restless legs syndrome: Secondary | ICD-10-CM | POA: Diagnosis not present

## 2018-06-30 DIAGNOSIS — M79604 Pain in right leg: Secondary | ICD-10-CM | POA: Diagnosis not present

## 2018-07-02 DIAGNOSIS — G2581 Restless legs syndrome: Secondary | ICD-10-CM | POA: Diagnosis not present

## 2018-07-02 DIAGNOSIS — E7800 Pure hypercholesterolemia, unspecified: Secondary | ICD-10-CM | POA: Diagnosis not present

## 2018-07-02 DIAGNOSIS — I10 Essential (primary) hypertension: Secondary | ICD-10-CM | POA: Diagnosis not present

## 2018-07-02 DIAGNOSIS — E669 Obesity, unspecified: Secondary | ICD-10-CM | POA: Diagnosis not present

## 2018-07-02 DIAGNOSIS — E559 Vitamin D deficiency, unspecified: Secondary | ICD-10-CM | POA: Diagnosis not present

## 2018-07-02 DIAGNOSIS — M79604 Pain in right leg: Secondary | ICD-10-CM | POA: Diagnosis not present

## 2018-07-30 DIAGNOSIS — S61459A Open bite of unspecified hand, initial encounter: Secondary | ICD-10-CM | POA: Diagnosis not present

## 2018-07-30 DIAGNOSIS — G4700 Insomnia, unspecified: Secondary | ICD-10-CM | POA: Diagnosis not present

## 2018-07-30 DIAGNOSIS — L089 Local infection of the skin and subcutaneous tissue, unspecified: Secondary | ICD-10-CM | POA: Diagnosis not present

## 2018-07-30 DIAGNOSIS — M7989 Other specified soft tissue disorders: Secondary | ICD-10-CM | POA: Diagnosis not present

## 2018-07-30 DIAGNOSIS — S61254A Open bite of right ring finger without damage to nail, initial encounter: Secondary | ICD-10-CM | POA: Diagnosis not present

## 2018-08-04 DIAGNOSIS — M25551 Pain in right hip: Secondary | ICD-10-CM | POA: Diagnosis not present

## 2018-08-05 ENCOUNTER — Other Ambulatory Visit: Payer: Self-pay | Admitting: Internal Medicine

## 2018-08-05 DIAGNOSIS — M79604 Pain in right leg: Secondary | ICD-10-CM

## 2018-08-05 DIAGNOSIS — M79605 Pain in left leg: Principal | ICD-10-CM

## 2018-08-06 ENCOUNTER — Other Ambulatory Visit: Payer: BLUE CROSS/BLUE SHIELD

## 2018-08-31 ENCOUNTER — Other Ambulatory Visit: Payer: Self-pay | Admitting: Cardiovascular Disease

## 2018-08-31 NOTE — Telephone Encounter (Signed)
Bystolic refilled. 

## 2018-11-19 DIAGNOSIS — S83422A Sprain of lateral collateral ligament of left knee, initial encounter: Secondary | ICD-10-CM | POA: Diagnosis not present

## 2018-11-19 DIAGNOSIS — M25562 Pain in left knee: Secondary | ICD-10-CM | POA: Diagnosis not present

## 2019-01-17 DIAGNOSIS — L237 Allergic contact dermatitis due to plants, except food: Secondary | ICD-10-CM | POA: Diagnosis not present

## 2019-02-04 DIAGNOSIS — I10 Essential (primary) hypertension: Secondary | ICD-10-CM | POA: Diagnosis not present

## 2019-02-04 DIAGNOSIS — R5383 Other fatigue: Secondary | ICD-10-CM | POA: Diagnosis not present

## 2019-02-04 DIAGNOSIS — E559 Vitamin D deficiency, unspecified: Secondary | ICD-10-CM | POA: Diagnosis not present

## 2019-02-22 DIAGNOSIS — Z6833 Body mass index (BMI) 33.0-33.9, adult: Secondary | ICD-10-CM | POA: Diagnosis not present

## 2019-02-22 DIAGNOSIS — Z13820 Encounter for screening for osteoporosis: Secondary | ICD-10-CM | POA: Diagnosis not present

## 2019-02-22 DIAGNOSIS — Z01419 Encounter for gynecological examination (general) (routine) without abnormal findings: Secondary | ICD-10-CM | POA: Diagnosis not present

## 2019-02-22 DIAGNOSIS — Z1231 Encounter for screening mammogram for malignant neoplasm of breast: Secondary | ICD-10-CM | POA: Diagnosis not present

## 2019-04-11 DIAGNOSIS — I10 Essential (primary) hypertension: Secondary | ICD-10-CM | POA: Diagnosis not present

## 2019-04-11 DIAGNOSIS — R21 Rash and other nonspecific skin eruption: Secondary | ICD-10-CM | POA: Diagnosis not present

## 2019-04-20 ENCOUNTER — Other Ambulatory Visit: Payer: Self-pay | Admitting: Vascular Surgery

## 2019-04-20 DIAGNOSIS — M79605 Pain in left leg: Secondary | ICD-10-CM

## 2019-04-20 DIAGNOSIS — M79604 Pain in right leg: Secondary | ICD-10-CM

## 2019-09-16 LAB — HM COLONOSCOPY

## 2019-10-27 ENCOUNTER — Other Ambulatory Visit (HOSPITAL_COMMUNITY): Payer: Self-pay | Admitting: Internal Medicine

## 2019-10-27 DIAGNOSIS — I6529 Occlusion and stenosis of unspecified carotid artery: Secondary | ICD-10-CM

## 2019-10-28 ENCOUNTER — Encounter (HOSPITAL_COMMUNITY): Payer: No Typology Code available for payment source

## 2019-10-28 ENCOUNTER — Ambulatory Visit (HOSPITAL_COMMUNITY): Admission: RE | Admit: 2019-10-28 | Payer: No Typology Code available for payment source | Source: Ambulatory Visit

## 2019-11-02 ENCOUNTER — Other Ambulatory Visit: Payer: Self-pay | Admitting: Internal Medicine

## 2019-11-07 ENCOUNTER — Ambulatory Visit (HOSPITAL_COMMUNITY)
Admission: RE | Admit: 2019-11-07 | Discharge: 2019-11-07 | Disposition: A | Discharge status: Home / Self Care | Payer: No Typology Code available for payment source | Source: Ambulatory Visit | Attending: Internal Medicine | Admitting: Internal Medicine

## 2019-11-07 ENCOUNTER — Other Ambulatory Visit: Payer: Self-pay

## 2019-11-07 DIAGNOSIS — I6529 Occlusion and stenosis of unspecified carotid artery: Secondary | ICD-10-CM | POA: Diagnosis not present

## 2019-11-07 NOTE — Progress Notes (Signed)
Carotid artery duplex completed. Refer to "CV Proc" under chart review to view preliminary results.  11/07/2019 9:31 AM Kelby Aline., MHA, RVT, RDCS, RDMS

## 2019-11-11 ENCOUNTER — Encounter: Payer: Self-pay | Admitting: Osteopathic Medicine

## 2019-11-11 ENCOUNTER — Ambulatory Visit (INDEPENDENT_AMBULATORY_CARE_PROVIDER_SITE_OTHER): Payer: No Typology Code available for payment source | Admitting: Osteopathic Medicine

## 2019-11-11 VITALS — BP 132/90 | HR 75 | Temp 98.3°F | Ht 63.0 in | Wt 172.0 lb

## 2019-11-11 DIAGNOSIS — J309 Allergic rhinitis, unspecified: Secondary | ICD-10-CM

## 2019-11-11 DIAGNOSIS — J3489 Other specified disorders of nose and nasal sinuses: Secondary | ICD-10-CM

## 2019-11-11 DIAGNOSIS — Z86010 Personal history of colonic polyps: Secondary | ICD-10-CM

## 2019-11-11 DIAGNOSIS — Z23 Encounter for immunization: Secondary | ICD-10-CM | POA: Diagnosis not present

## 2019-11-11 DIAGNOSIS — Z90710 Acquired absence of both cervix and uterus: Secondary | ICD-10-CM

## 2019-11-11 DIAGNOSIS — R5382 Chronic fatigue, unspecified: Secondary | ICD-10-CM | POA: Diagnosis not present

## 2019-11-11 DIAGNOSIS — E559 Vitamin D deficiency, unspecified: Secondary | ICD-10-CM

## 2019-11-11 DIAGNOSIS — E782 Mixed hyperlipidemia: Secondary | ICD-10-CM

## 2019-11-11 DIAGNOSIS — Z87891 Personal history of nicotine dependence: Secondary | ICD-10-CM

## 2019-11-11 NOTE — Progress Notes (Signed)
Gabriela Howard is a 59 y.o. female who presents to  [facility] [place] [facility]  today, 11/11/19, seeking care for the following: . New to establish  . Requests ENT referral for recurrent sinus infection      ASSESSMENT & PLAN with other pertinent history/findings:  The primary encounter diagnosis was Chronic fatigue. Diagnoses of Need for shingles vaccine, Sinus pressure, History of colon polyps, Mixed hyperlipidemia, Allergic rhinitis, unspecified seasonality, unspecified trigger, Vitamin D deficiency, Former smoker, and History of hysterectomy were also pertinent to this visit.  Reviewed recent CMP - no concerns   There are no Patient Instructions on file for this visit.   Orders Placed This Encounter  Procedures  . Varicella-zoster vaccine IM (Shingrix)  . Ambulatory referral to ENT    No orders of the defined types were placed in this encounter.   Constitutional:  . VSS, see nurse notes . General Appearance: alert, well-developed, well-nourished, NAD Eyes, Ears: . Normal lids and conjunctive, non-icteric sclera . PERRLA . Normal external auditory canal and TM bilaterally Neck: . No masses, trachea midline . No thyroid enlargement/tenderness/mass appreciated Respiratory: . Normal respiratory effort . Breath sounds normal, no wheeze/rhonchi/rales Cardiovascular: . S1/S2 normal, no murmur/rub/gallop auscultated . No lower extremity edema Gastrointestinal: . Nontender, no masses . No hepatomegaly, no splenomegaly . No hernia appreciated Musculoskeletal:  . Gait normal . No clubbing/cyanosis of digits Neurological: . No cranial nerve deficit on limited exam . Motor and sensation intact and symmetric Psychiatric: . Normal judgment/insight . Normal mood and affect    Follow-up instructions: Return in about 6 months (around 05/12/2020) for ANNUAL (call week prior to visit for lab  orders).                                         BP 132/90 (BP Location: Left Arm, Patient Position: Sitting, Cuff Size: Normal)   Pulse 75   Temp 98.3 F (36.8 C) (Oral)   Ht 5\' 3"  (1.6 m)   Wt 172 lb (78 kg)   LMP 04/05/2016   BMI 30.47 kg/m   Current Meds  Medication Sig  . Cholecalciferol (VITAMIN D) 2000 units CAPS Take 1 capsule by mouth daily.  . Multiple Vitamin (MULTIVITAMIN WITH MINERALS) TABS tablet Take 1 tablet by mouth daily.    No results found for this or any previous visit (from the past 72 hour(s)).  No results found.  Depression screen PHQ 2/9 11/11/2019  Decreased Interest 1  Down, Depressed, Hopeless 0  PHQ - 2 Score 1  Altered sleeping 3  Tired, decreased energy 2  Change in appetite 1  Feeling bad or failure about yourself  0  Trouble concentrating 1  Moving slowly or fidgety/restless 1  Suicidal thoughts 0  PHQ-9 Score 9  Difficult doing work/chores Somewhat difficult    GAD 7 : Generalized Anxiety Score 11/11/2019  Nervous, Anxious, on Edge 0  Control/stop worrying 0  Worry too much - different things 0  Trouble relaxing 0  Restless 0  Easily annoyed or irritable 0  Afraid - awful might happen 0  Total GAD 7 Score 0      All questions at time of visit were answered - patient instructed to contact office with any additional concerns or updates.  ER/RTC precautions were reviewed with the patient.  Please note: voice recognition software was used to  produce this document, and typos may escape review. Please contact Dr. Sheppard Coil for any needed clarifications.

## 2019-11-21 ENCOUNTER — Encounter: Payer: Self-pay | Admitting: Osteopathic Medicine

## 2019-12-06 ENCOUNTER — Ambulatory Visit (INDEPENDENT_AMBULATORY_CARE_PROVIDER_SITE_OTHER): Payer: No Typology Code available for payment source | Admitting: Otolaryngology

## 2019-12-06 ENCOUNTER — Other Ambulatory Visit: Payer: Self-pay

## 2019-12-06 ENCOUNTER — Encounter (INDEPENDENT_AMBULATORY_CARE_PROVIDER_SITE_OTHER): Payer: Self-pay | Admitting: Otolaryngology

## 2019-12-06 VITALS — Temp 97.7°F

## 2019-12-06 DIAGNOSIS — J310 Chronic rhinitis: Secondary | ICD-10-CM

## 2019-12-06 NOTE — Progress Notes (Signed)
HPI: Gabriela Howard is a 59 y.o. female who presents is referred by Dr. Sheppard Coil for evaluation of nasal sinus complaints.  She complains mostly of nasal obstruction which is worse at night.  It alternates sometimes from side to side.  She had previous nasal surgery with Dr Benjamine Mola in 2018 when he performed a septoplasty and turbinate reductions.  This seemed to do well for about a year but then she has had recurrent symptoms of congestion and what she calls "sinus" issues.  She has occasional headaches.  She denies any yellow-green discharge from her nose.  She has occasional right ear discomfort.  She was just recently seen in a emergency department and treated with cephalexin for sinus infection and vertigo.  The vertigo has resolved but she still has chronic problems with nasal congestion especially at night. She is presently using Nasacort on a regular basis. She had a CT scan of her head performed in October 2019 that showed clear paranasal sinuses..  Past Medical History:  Diagnosis Date  . Anxiety   . Colon polyps   . Depression   . High cholesterol   . Insomnia   . Uterine fibroid    Past Surgical History:  Procedure Laterality Date  . ABDOMINAL HYSTERECTOMY  05/03/2016   LAVH, total  . WRIST SURGERY     Social History   Socioeconomic History  . Marital status: Married    Spouse name: Not on file  . Number of children: 3  . Years of education: Not on file  . Highest education level: Not on file  Occupational History  . Occupation: IT    Employer: [employer]  Tobacco Use  . Smoking status: Former Smoker    Packs/day: 1.00    Years: 12.00    Pack years: 12.00    Types: Cigarettes    Quit date: 1994    Years since quitting: 27.5  . Smokeless tobacco: Never Used  Vaping Use  . Vaping Use: Never used  Substance and Sexual Activity  . Alcohol use: Yes    Alcohol/week: 1.0 standard drink    Types: 1 Standard drinks or equivalent per week    Comment: occ   . Drug use: No  . Sexual activity: Not Currently    Birth control/protection: Surgical, Abstinence    Comment: LAVH  Other Topics Concern  . Not on file  Social History Narrative  . Not on file   Social Determinants of Health   Financial Resource Strain:   . Difficulty of Paying Living Expenses:   Food Insecurity:   . Worried About Charity fundraiser in the Last Year:   . Arboriculturist in the Last Year:   Transportation Needs:   . Film/video editor (Medical):   Marland Kitchen Lack of Transportation (Non-Medical):   Physical Activity:   . Days of Exercise per Week:   . Minutes of Exercise per Session:   Stress:   . Feeling of Stress :   Social Connections:   . Frequency of Communication with Friends and Family:   . Frequency of Social Gatherings with Friends and Family:   . Attends Religious Services:   . Active Member of Clubs or Organizations:   . Attends Archivist Meetings:   Marland Kitchen Marital Status:    Family History  Problem Relation Age of Onset  . Thyroid disease Mother        Blood clots  . Hypertension Mother   . Heart disease  Father   . Alzheimer's disease Father   . High Cholesterol Father   . Hyperlipidemia Brother   . Prostate cancer Maternal Uncle    Allergies  Allergen Reactions  . Aleve [Naproxen Sodium] Other (See Comments)    Reaction:  GI upset   . Sulfa Antibiotics Other (See Comments)    Reaction:  Unknown   . Other Nausea Only    MEDICATION TO NUMB GUMS FOR DEEP CLEANING LIGHTHEADNESS, AND JITTERY PER PT.    Prior to Admission medications   Medication Sig Start Date End Date Taking? Authorizing Provider  Ascorbic Acid (VITAMIN C) 1000 MG tablet Take 1,000 mg by mouth daily.    Yes [provider]  BYSTOLIC 5 MG tablet TAKE 1 TABLET BY MOUTH EVERY DAY 08/31/18  Yes Troy Sine, MD  Cholecalciferol (VITAMIN D) 2000 units CAPS Take 1 capsule by mouth daily.   Yes [provider]  Collagen Hydrolysate POWD by Does not  apply route.    Yes [provider]  Magnesium 500 MG CAPS Take 1 capsule by mouth daily.    Yes [provider]  Multiple Vitamin (MULTIVITAMIN WITH MINERALS) TABS tablet Take 1 tablet by mouth daily.   Yes [provider]  ondansetron (ZOFRAN) 4 MG tablet Take 1 tablet (4 mg total) by mouth every 6 (six) hours. 01/30/18  Yes Phelps, Erin O, PA-C  zolpidem (AMBIEN) 5 MG tablet Take 5 mg by mouth at bedtime as needed for sleep.    Yes [provider]  rosuvastatin (CRESTOR) 10 MG tablet Take 1 tablet (10 mg total) by mouth daily. 08/07/17 11/05/17  Troy Sine, MD     Positive ROS: Otherwise negative  All other systems have been reviewed and were otherwise negative with the exception of those mentioned in the HPI and as above.  Physical Exam: Constitutional: Alert, well-appearing, no acute distress Ears: External ears without lesions or tenderness. Ear canals are clear bilaterally with intact, clear TMs bilaterally.  Hearing screening with a 512 1024 tuning fork revealed symmetric hearing with AC > BC bilaterally. Nasal: External nose without lesions. Septum midline with mild rhinitis.  She has persistent moderate size turbinates bilaterally.  Both middle meatus regions were clear with no signs of infection.  No polyps or intranasal lesions noted.. Oral: Lips and gums without lesions. Tongue and palate mucosa without lesions. Posterior oropharynx clear. Neck: No palpable adenopathy or masses Respiratory: Breathing comfortably  Skin: No facial/neck lesions or rash noted.  Procedures  Assessment: Chronic reactive rhinitis Moderate turbinate hypertrophy.  Plan: Would initially recommend regular use of nasal steroid avoid spray Nasacort which she is presently using 2 sprays each nostril at night. Today also prescribed azelastine 0.1% 1 spray twice daily as this will also help with nasal congestion. During the daytime recommended use of saline  irrigation. Today after spraying her nose with Afrin in the office for examination she felt like she breathed much better following use of the decongestant spray but I cautioned her that this can be habit-forming and would not use it more than once or twice a week when symptoms are severe. Could possibly consider further turbinate reductions down the road if medical therapy is not successful.  But clinically no evidence of active sinus disease or infection and on review of previous CT scan sinuses were clear.  She might benefit by seeing an allergist.   Radene Journey, MD   CC:

## 2020-04-02 ENCOUNTER — Encounter: Payer: No Typology Code available for payment source | Admitting: Osteopathic Medicine

## 2020-05-04 DIAGNOSIS — E785 Hyperlipidemia, unspecified: Secondary | ICD-10-CM | POA: Insufficient documentation

## 2020-05-17 ENCOUNTER — Ambulatory Visit (INDEPENDENT_AMBULATORY_CARE_PROVIDER_SITE_OTHER): Payer: No Typology Code available for payment source | Admitting: Sports Medicine

## 2020-05-17 ENCOUNTER — Ambulatory Visit (INDEPENDENT_AMBULATORY_CARE_PROVIDER_SITE_OTHER): Payer: No Typology Code available for payment source

## 2020-05-17 ENCOUNTER — Other Ambulatory Visit: Payer: Self-pay

## 2020-05-17 ENCOUNTER — Ambulatory Visit: Payer: No Typology Code available for payment source

## 2020-05-17 DIAGNOSIS — W2209XA Striking against other stationary object, initial encounter: Secondary | ICD-10-CM

## 2020-05-17 DIAGNOSIS — S92511A Displaced fracture of proximal phalanx of right lesser toe(s), initial encounter for closed fracture: Secondary | ICD-10-CM | POA: Diagnosis not present

## 2020-05-17 DIAGNOSIS — S99921A Unspecified injury of right foot, initial encounter: Secondary | ICD-10-CM | POA: Diagnosis not present

## 2020-05-17 MED ORDER — HYDROCODONE-ACETAMINOPHEN 5-325 MG PO TABS: 1.0000 | ORAL_TABLET | Freq: Three times a day (TID) | ORAL | 0 refills | Status: DC | PRN

## 2020-05-17 MED ORDER — IBUPROFEN 800 MG PO TABS: 800.0000 mg | ORAL_TABLET | Freq: Three times a day (TID) | ORAL | 2 refills | Status: DC | PRN

## 2020-05-17 NOTE — Assessment & Plan Note (Signed)
This is a pleasant 59 year old female, she kicked a doorway by accident about 2 weeks ago, had pain, swelling, bruising, mostly over the third MTP. She does note a bit of a deformity. We buddy taped her second and third toes together, getting some x-rays, she already has a postop shoe, ibuprofen 800 3 times daily and hydrocodone 5/325 for breakthrough pain. Return to see me in 3 to 4 weeks.

## 2020-05-17 NOTE — Progress Notes (Signed)
    Procedures performed today:    None.  Independent interpretation of notes and tests performed by another provider:   None.  Brief History, Exam, Impression, and Recommendations:    Injury of foot, right This is a pleasant 59 year old female, she kicked a doorway by accident about 2 weeks ago, had pain, swelling, bruising, mostly over the third MTP. She does note a bit of a deformity. We buddy taped her second and third toes together, getting some x-rays, she already has a postop shoe, ibuprofen 800 3 times daily and hydrocodone 5/325 for breakthrough pain. Return to see me in 3 to 4 weeks.    ___________________________________________ Gwen Her. Dianah Field, M.D., ABFM., CAQSM. Primary Care and [facility] Instructor of [place] [facility]

## 2020-05-23 ENCOUNTER — Telehealth: Payer: Self-pay

## 2020-05-23 NOTE — Telephone Encounter (Signed)
Postop shoe is fine.

## 2020-05-23 NOTE — Telephone Encounter (Signed)
Patient calls to request a "boot". She states that the one she has a home is just too old.  In your note you stated post op shoe, patient stated boot. Which does she need? Please advise.

## 2020-05-24 NOTE — Telephone Encounter (Signed)
If you can get patient in some time this afternoon, I'll fit her for a post op shoe.

## 2020-05-24 NOTE — Telephone Encounter (Signed)
Called patient to see if she could come in today and she stated she could not make it today, let her know that we are closed tomorrow and she stated she would call next week to schedule this. AM

## 2020-05-26 NOTE — Telephone Encounter (Signed)
Another option would be to just buy 1 through [employer].

## 2020-05-31 NOTE — Telephone Encounter (Signed)
Spoke with patient and recommended her to look on [employer] to get post-op shoe.  Patient agreed she would order through [facility] vs. coming in to get one.

## 2020-06-28 ENCOUNTER — Ambulatory Visit: Payer: No Typology Code available for payment source | Admitting: Sports Medicine

## 2020-07-11 ENCOUNTER — Ambulatory Visit: Payer: No Typology Code available for payment source | Admitting: Medical-Surgical

## 2020-07-13 ENCOUNTER — Telehealth: Payer: Self-pay

## 2020-07-13 DIAGNOSIS — E7800 Pure hypercholesterolemia, unspecified: Secondary | ICD-10-CM | POA: Insufficient documentation

## 2020-07-13 DIAGNOSIS — F419 Anxiety disorder, unspecified: Secondary | ICD-10-CM | POA: Insufficient documentation

## 2020-07-13 DIAGNOSIS — I7090 Unspecified atherosclerosis: Secondary | ICD-10-CM | POA: Insufficient documentation

## 2020-07-13 DIAGNOSIS — I10 Essential (primary) hypertension: Secondary | ICD-10-CM | POA: Insufficient documentation

## 2020-07-13 NOTE — Telephone Encounter (Signed)
Transition Care Management Follow-up Telephone Call  Date of discharge and from where: 07/12/2020 from [facility]  How have you been since you were released from the hospital? Pt states that she is feeling okay considering. Pt was bit by a dog and has been referred to ortho/hand surgeon for further evaluation.   Any questions or concerns? No  Items Reviewed:  Did the pt receive and understand the discharge instructions provided? Yes   Medications obtained and verified? Yes   Other? No   Any new allergies since your discharge? No   Dietary orders reviewed?N/A  Do you have support at home? Yes   Functional Questionnaire: (I = Independent and D = Dependent) ADLs: I  Bathing/Dressing- I  Meal Prep- I  Eating- I  Maintaining continence- I  Transferring/Ambulation- I  Managing Meds- I   Follow up appointments reviewed:   [facility] f/u appt confirmed? Yes  Scheduled to see 07/13/2020 @ 10:00am.  Are transportation arrangements needed? No  If their condition worsens, is the pt aware to call PCP or go to the Emergency Dept.? Yes Was the patient provided with contact information for the PCP's office or ED? Yes Was to pt encouraged to call back with questions or concerns? Yes

## 2020-07-17 ENCOUNTER — Telehealth: Payer: Self-pay

## 2020-07-17 DIAGNOSIS — S61259A Open bite of unspecified finger without damage to nail, initial encounter: Secondary | ICD-10-CM | POA: Insufficient documentation

## 2020-07-17 DIAGNOSIS — W540XXA Bitten by dog, initial encounter: Secondary | ICD-10-CM | POA: Insufficient documentation

## 2020-07-17 NOTE — Telephone Encounter (Signed)
Transition Care Management Unsuccessful Follow-up Telephone Call  Date of discharge and from where:  07/16/2020 from [facility]  Attempts:  1st Attempt  Reason for unsuccessful TCM follow-up call:  Left voice message

## 2020-07-18 NOTE — Telephone Encounter (Signed)
Transition Care Management Follow-up Telephone Call  Date of discharge and from where: 07/16/2020 from [facility]  How have you been since you were released from the hospital? Patient states that she is feeling much better and has no questions or concerns at this time.   Any questions or concerns? No  Items Reviewed:  Did the pt receive and understand the discharge instructions provided? Yes   Medications obtained and verified? Yes   Other? No   Any new allergies since your discharge? No   Dietary orders reviewed? N/A  Do you have support at home? Yes   Functional Questionnaire: (I = Independent and D = Dependent) ADLs: I  Bathing/Dressing- I  Meal Prep- I  Eating- I  Maintaining continence- I  Transferring/Ambulation- I  Managing Meds- I   Follow up appointments reviewed:   PCP Hospital f/u appt confirmed? No  Scheduled to see Samuel Bouche, PA on 07/20/2020 @ 3:40pm.  Are transportation arrangements needed? No   If their condition worsens, is the pt aware to call PCP or go to the Emergency Dept.? Yes  Was the patient provided with contact information for the PCP's office or ED? Yes  Was to pt encouraged to call back with questions or concerns? Yes

## 2020-07-20 ENCOUNTER — Encounter: Payer: Self-pay | Admitting: Medical-Surgical

## 2020-07-20 ENCOUNTER — Ambulatory Visit: Payer: No Typology Code available for payment source | Admitting: Medical-Surgical

## 2020-07-20 ENCOUNTER — Other Ambulatory Visit: Payer: Self-pay

## 2020-07-20 VITALS — BP 143/90 | HR 84 | Temp 97.9°F | Resp 20 | Ht 63.0 in | Wt 175.0 lb

## 2020-07-20 DIAGNOSIS — I10 Essential (primary) hypertension: Secondary | ICD-10-CM

## 2020-07-20 DIAGNOSIS — Z09 Encounter for follow-up examination after completed treatment for conditions other than malignant neoplasm: Secondary | ICD-10-CM

## 2020-07-20 MED ORDER — METOPROLOL SUCCINATE ER 50 MG PO TB24
50.0000 mg | ORAL_TABLET | Freq: Every day | ORAL | 0 refills | Status: DC
Start: 2020-07-20 — End: 2020-08-08

## 2020-07-20 NOTE — Progress Notes (Signed)
Subjective:    CC: hospital follow up  HPI: Pleasant 60 year old female presenting for a hospital follow up. She was seen in the ED on 07/12/2020 after being bitten on the right hand by a stray dog she was trying to rescue. She had waited several days after the bite without getting medical attention. She developed severe swelling of the right hand extending up to the forearm with erythema and heat. She did clean the area at home but when symptoms became so severe, she decided to go to the ED. On evaluation, she was found to have a mildly displaced comminuted fracture of the proximal first metacarpal bone in addition to significant infection. She was prescribed Norco and Augmentin then discharged home with instructions to follow up with a hand specialist. Over the next few days, she noted that her head was hurting quite a bit so she checked her BP with a reading of 200s/100s. She rechecked it with an even higher reading. At that point, she returned to the ED where they completed a workup and monitored her BP without administering medications. Last BP recorded was 140s/80s. She was instructed to see her PCP regarding her BP. Today, her BP is again elevated. Denies a history of HTN. Was prescribed Bystolic in 4081 by cardiology but never started it. Endorses intermittent palpitations and an increase in her anxiety lately. Notes her BP cuff has been validated for accuracy at her surgeon's office. Over the past 4 days her home BPs have ranged from 140-180/80-124. She is scheduled to have hand surgery on 3/7 to repair her fracture and needs to have better control of her BP by then.   I reviewed the past medical history, family history, social history, surgical history, and allergies today and no changes were needed.  Please see the problem list section below in epic for further details.  Past Medical History: Past Medical History:  Diagnosis Date  . Anxiety   . Colon polyps   . Depression   . High  cholesterol   . Insomnia   . Uterine fibroid    Past Surgical History: Past Surgical History:  Procedure Laterality Date  . ABDOMINAL HYSTERECTOMY  05/03/2016   LAVH, total  . WRIST SURGERY     Social History: Social History   Socioeconomic History  . Marital status: Married    Spouse name: Not on file  . Number of children: 3  . Years of education: Not on file  . Highest education level: Not on file  Occupational History  . Occupation: IT    Employer: [employer]  Tobacco Use  . Smoking status: Former Smoker    Packs/day: 1.00    Years: 12.00    Pack years: 12.00    Types: Cigarettes    Quit date: 1994    Years since quitting: 28.1  . Smokeless tobacco: Never Used  Vaping Use  . Vaping Use: Never used  Substance and Sexual Activity  . Alcohol use: Yes    Alcohol/week: 1.0 standard drink    Types: 1 Standard drinks or equivalent per week    Comment: occ  . Drug use: No  . Sexual activity: Not Currently    Birth control/protection: Surgical, Abstinence    Comment: LAVH  Other Topics Concern  . Not on file  Social History Narrative  . Not on file   Social Determinants of Health   Financial Resource Strain: Not on file  Food Insecurity: Not on file  Transportation Needs: Not on file  Physical Activity: Not on file  Stress: Not on file  Social Connections: Not on file   Family History: Family History  Problem Relation Age of Onset  . Thyroid disease Mother        Blood clots  . Hypertension Mother   . Heart disease Father   . Alzheimer's disease Father   . High Cholesterol Father   . Hyperlipidemia Brother   . Prostate cancer Maternal Uncle    Allergies: Allergies  Allergen Reactions  . Sulfa Antibiotics Other (See Comments) and Hives    Reaction:  Unknown  Other reaction(s): Other (See Comments) Reaction: Unknown    . Aleve [Naproxen Sodium] Other (See Comments)    Reaction:  GI upset   . Other Nausea Only    MEDICATION TO NUMB  GUMS FOR DEEP CLEANING LIGHTHEADNESS, AND JITTERY PER PT.    Medications: See med rec.  Review of Systems: See HPI for pertinent positives and negatives.   Objective:    General: Well Developed, well nourished, and in no acute distress.  Neuro: Alert and oriented x3.  HEENT: Normocephalic, atraumatic  Skin: Warm and dry. Cardiac: Regular rate and rhythm, no murmurs rubs or gallops, no lower extremity edema.  Respiratory: Clear to auscultation bilaterally. Not using accessory muscles, speaking in full sentences.  Impression and Recommendations:    1. Hospital discharge follow-up Reviewed available information and discussed concerns with patient regarding her blood pressure, anxiety, and associated symptoms.   2. Hypertension Although she reports a history of low blood pressures, she has had several elevated BP's over the past several months to qualify for a new diagnosis of Hypertension. Discussed lifestyle modifications and recommended BP goals. With her increased anxiety and palpitations, starting Toprol XL daily. Advised to monitor BP and heart rate at home. Plan to follow up in 1 week to evaluate BP response and allow for documentation of controlled HTN prior to surgery. Patient verbalized understanding and is agreeable to the plan.   Return in about 1 week (around 07/27/2020) for nurse visit for BP check. ___________________________________________ Clearnce Sorrel, DNP, APRN, FNP-BC Primary Care and [place]

## 2020-07-23 ENCOUNTER — Inpatient Hospital Stay: Payer: No Typology Code available for payment source | Admitting: Osteopathic Medicine

## 2020-07-24 ENCOUNTER — Telehealth: Payer: Self-pay

## 2020-07-24 NOTE — Telephone Encounter (Signed)
I haven't seen her in 8 mos, question can go to [facility] or she can see me in office to address medication changes. BP not severely elevated so not urgent can eb sometime next week   BP Readings from Last 3 Encounters:  07/20/20 (!) 143/90  11/11/19 132/90  01/30/18 129/87

## 2020-07-24 NOTE — Telephone Encounter (Signed)
Routed incorrectly in reply.

## 2020-07-24 NOTE — Telephone Encounter (Signed)
Would need clarification regarding what aspect is not working? Does she mean it's not fixing her anxiety or it's not controlling her blood pressure. Recommend continuing the medication as prescribed then returning as instructed unless her blood pressure is elevated. If she is concerned that we need to adjust her dose earlier than 2 weeks, she should return for evaluation and discussion.

## 2020-07-24 NOTE — Telephone Encounter (Signed)
Pt called stating that the blood pressure prescribed by Caryl Asp is not working. Requesting an alternative rx. Pls advise.

## 2020-07-25 NOTE — Telephone Encounter (Signed)
Pt states that bp range is 135's-149's/70's-90's. Pt is due to have surgery on Monday. She has a nurse visit on Friday at 1030 am. Pt would like to discuss changing the medication.

## 2020-07-25 NOTE — Telephone Encounter (Signed)
If coming in for nurse visit needs to bring home BP monitor to verify Otherwise i'm not too concerned about BP a bit above goal and shouldn't be a problem for surgery No further input form me on this without appt.Marland KitchenMarland Kitchen

## 2020-07-25 NOTE — Telephone Encounter (Signed)
FYI - Pt's bp machine was calibrated last Friday during her nurse visit. Does provider want patient to bring in their bp machine again? Pls advise, thanks.

## 2020-07-25 NOTE — Telephone Encounter (Signed)
No

## 2020-07-27 ENCOUNTER — Ambulatory Visit: Payer: No Typology Code available for payment source

## 2020-07-27 ENCOUNTER — Other Ambulatory Visit: Payer: Self-pay

## 2020-07-27 ENCOUNTER — Ambulatory Visit: Payer: No Typology Code available for payment source | Admitting: Family Medicine

## 2020-07-27 ENCOUNTER — Encounter: Payer: Self-pay | Admitting: Family Medicine

## 2020-07-27 VITALS — BP 118/80 | HR 75 | Wt 173.0 lb

## 2020-07-27 DIAGNOSIS — A0472 Enterocolitis due to Clostridium difficile, not specified as recurrent: Secondary | ICD-10-CM | POA: Diagnosis not present

## 2020-07-27 DIAGNOSIS — R197 Diarrhea, unspecified: Secondary | ICD-10-CM | POA: Diagnosis not present

## 2020-07-27 DIAGNOSIS — F419 Anxiety disorder, unspecified: Secondary | ICD-10-CM | POA: Diagnosis not present

## 2020-07-27 DIAGNOSIS — I10 Essential (primary) hypertension: Secondary | ICD-10-CM | POA: Diagnosis not present

## 2020-07-27 MED ORDER — HYDROXYZINE HCL 50 MG PO TABS: 50.0000 mg | ORAL_TABLET | Freq: Three times a day (TID) | ORAL | 0 refills | Status: DC | PRN

## 2020-07-27 NOTE — Progress Notes (Signed)
Acute Office Visit  Subjective:    Patient ID: Gabriela Howard, female    DOB: 12/18/1960, 60 y.o.   MRN: [id number]  CC: diarrhea, BP issues, dog bite f/u - anxiety  HPI Patient is in today for diarrhea, BP, and anxiety.   On February 13, patient was bit by a dog; she ended up being seen in the ED, was started on ABX and is now scheduled for surgery on Monday. (See previous notes).   BP concerns: Patient had been taking her BP at home after seeing it elevated at her ED visits. She came to see Samuel Bouche, FNP for hospital follow-up and was started on metoprolol for BP management in hopes that this may also help some of her anxiety. She states she has been taking is as prescribed but continues to have elevated BP readings at home (140/93, 143/85, 137/86, 143/85). No chest pain, shortness of breath, palpitations, headaches.   Diarrhea: Patient reports she started to have diarrhea within the first 2 days of starting her ABX (she was given IM clindamycin in ED and started on Augmentin). Reports the diarrhea improved for a few days, but restarted. For the past 2 days she reports at least 12 episodes a day associated with cramping lasting a few minutes prior to defecation with immediate relief after passing stool; denies fevers and nausea/vomiting. Eating and drinking seems to trigger the episodes. She describes the diarrhea as yellow, clumpy/seedy with an unusual odor. She did bring a sample today, because her friend is a Marine scientist and told her we would need one to check for C. diff or other infectious causes. She tried one dose of imodium today to give her enough of a break to get out for her appointment and it seems to have helped so far.   Anxiety: Patient is reported ongoing anxiety the past few weeks with flashbacks of the dog attach, concerns for her hand, her own dog who was also injured, BP concerns and now diarrhea and upcoming surgery as another stressor. She feels like the anxiety is getting  bad enough she has trouble falling asleep at night because her mind is racing. She has never tried anything for anxiety.    Past Medical History:  Diagnosis Date  . Anxiety   . Colon polyps   . Depression   . High cholesterol   . Insomnia   . Uterine fibroid     Past Surgical History:  Procedure Laterality Date  . ABDOMINAL HYSTERECTOMY  05/03/2016   LAVH, total  . WRIST SURGERY      Family History  Problem Relation Age of Onset  . Thyroid disease Mother        Blood clots  . Hypertension Mother   . Heart disease Father   . Alzheimer's disease Father   . High Cholesterol Father   . Hyperlipidemia Brother   . Prostate cancer Maternal Uncle     Social History   Socioeconomic History  . Marital status: Married    Spouse name: Not on file  . Number of children: 3  . Years of education: Not on file  . Highest education level: Not on file  Occupational History  . Occupation: IT    Employer: [employer]  Tobacco Use  . Smoking status: Former Smoker    Packs/day: 1.00    Years: 12.00    Pack years: 12.00    Types: Cigarettes    Quit date: 1994    Years since quitting: 28.1  .  Smokeless tobacco: Never Used  Vaping Use  . Vaping Use: Never used  Substance and Sexual Activity  . Alcohol use: Yes    Alcohol/week: 1.0 standard drink    Types: 1 Standard drinks or equivalent per week    Comment: occ  . Drug use: No  . Sexual activity: Not Currently    Birth control/protection: Surgical, Abstinence    Comment: LAVH  Other Topics Concern  . Not on file  Social History Narrative  . Not on file   Social Determinants of Health   Financial Resource Strain: Not on file  Food Insecurity: Not on file  Transportation Needs: Not on file  Physical Activity: Not on file  Stress: Not on file  Social Connections: Not on file  Intimate Partner Violence: Not on file    Outpatient Medications Prior to Visit  Medication Sig Dispense Refill  . Ascorbic Acid  (VITAMIN C) 1000 MG tablet Take 1,000 mg by mouth daily.     . Cholecalciferol (VITAMIN D) 2000 units CAPS Take 1 capsule by mouth daily.    . Collagen Hydrolysate POWD by Does not apply route.     Marland Kitchen HYDROcodone-acetaminophen (NORCO/VICODIN) 5-325 MG tablet Take 1 tablet by mouth every 8 (eight) hours as needed for moderate pain. 15 tablet 0  . ibuprofen (ADVIL) 800 MG tablet Take 1 tablet (800 mg total) by mouth every 8 (eight) hours as needed. 90 tablet 2  . Magnesium 500 MG CAPS Take 1 capsule by mouth daily.     . metoprolol succinate (TOPROL-XL) 50 MG 24 hr tablet Take 1 tablet (50 mg total) by mouth daily. Take with or immediately following a meal. 90 tablet 0  . Multiple Vitamin (MULTIVITAMIN WITH MINERALS) TABS tablet Take 1 tablet by mouth daily. (Patient not taking: Reported on 07/20/2020)    . ondansetron (ZOFRAN) 4 MG tablet Take 1 tablet (4 mg total) by mouth every 6 (six) hours. 12 tablet 0  . rosuvastatin (CRESTOR) 10 MG tablet Take 1 tablet (10 mg total) by mouth daily. 90 tablet 3  . zolpidem (AMBIEN) 5 MG tablet Take 5 mg by mouth at bedtime as needed for sleep.  (Patient not taking: Reported on 07/20/2020)     No facility-administered medications prior to visit.    Allergies  Allergen Reactions  . Sulfa Antibiotics Other (See Comments) and Hives    Reaction:  Unknown  Other reaction(s): Other (See Comments) Reaction: Unknown    . Aleve [Naproxen Sodium] Other (See Comments)    Reaction:  GI upset   . Other Nausea Only    MEDICATION TO NUMB GUMS FOR DEEP CLEANING LIGHTHEADNESS, AND JITTERY PER PT.     Review of Systems All review of systems negative except what is listed in the HPI     Objective:    Physical Exam Vitals reviewed.  Constitutional:      Appearance: Normal appearance.  Cardiovascular:     Rate and Rhythm: Normal rate and regular rhythm.  Pulmonary:     Effort: Pulmonary effort is normal.     Breath sounds: Normal breath sounds.  Abdominal:      General: There is no distension.     Palpations: Abdomen is soft.     Tenderness: There is no abdominal tenderness. There is no guarding.  Neurological:     General: No focal deficit present.     Mental Status: She is alert and oriented to person, place, and time.  Psychiatric:  Mood and Affect: Mood normal.        Behavior: Behavior normal.        Thought Content: Thought content normal.        Judgment: Judgment normal.     LMP 04/05/2016  Wt Readings from Last 3 Encounters:  07/20/20 175 lb (79.4 kg)  11/11/19 172 lb (78 kg)  01/30/18 172 lb (78 kg)    Health Maintenance Due  Topic Date Due  . Hepatitis C Screening  Never done  . HIV Screening  Never done    There are no preventive care reminders to display for this patient.   No results found for: TSH Lab Results  Component Value Date   WBC 11.7 (H) 05/09/2016   HGB 10.4 (L) 05/09/2016   HCT 31.8 (L) 05/09/2016   MCV 82.6 05/09/2016   PLT 306 05/09/2016   Lab Results  Component Value Date   NA 141 05/08/2016   K 4.2 05/08/2016   CO2 28 05/08/2016   GLUCOSE 102 (H) 05/08/2016   BUN 9 05/08/2016   CREATININE 0.71 05/08/2016   BILITOT 0.5 05/08/2016   ALKPHOS 57 05/08/2016   AST 11 (L) 05/08/2016   ALT 11 (L) 05/08/2016   PROT 6.2 (L) 05/08/2016   ALBUMIN 3.3 (L) 05/08/2016   CALCIUM 8.5 (L) 05/08/2016   ANIONGAP 6 05/08/2016   No results found for: CHOL No results found for: HDL No results found for: LDLCALC No results found for: TRIG No results found for: CHOLHDL No results found for: HGBA1C     Assessment & Plan:   1. Diarrhea, unspecified type Given that she was recently on ABX and description of diarrhea, will go ahead and check for c.diff as well as stool culture for other infectious causes. She states that the surgeon will not do her hand surgery if there is any sort of infection because they are planning to place a plate. Told her to let them know what is going on. We will let her  know what the results are when they come back. Encouraged her to avoid taking any more imodium until we get cultures back in case there is an infectious cause. Educated on warning signs and symptoms requiring urgent evaluation.   - C. difficile GDH and Toxin A/B - Aerobic culture  2. Anxiety Patient has a lot going on right now as potential triggers for some anxiety. She has never taken anything for anxiety in the past. Will give her a trial of hydroxyzine to use as needed during this stressful time. Educated on medication use and safety. Will need to follow-up on this after her surgery.   - hydrOXYzine (ATARAX/VISTARIL) 50 MG tablet; Take 1 tablet (50 mg total) by mouth 3 (three) times daily as needed for anxiety.  Dispense: 30 tablet; Refill: 0  3. Hypertension, unspecified type BP is great in office today. Will not make any changes at this time. I think anxiety is definitely a contributing factor, so we will see if getting that under control will help her home numbers improve. Follow-up after surgery.  Encouraged patient to follow-up after surgery, or sooner if symptoms worsen or fail to improve.     Terrilyn Saver, NP

## 2020-07-27 NOTE — Patient Instructions (Signed)
Hydroxyzine capsules or tablets What is this medicine? HYDROXYZINE (hye [place] i zeen) is an antihistamine. This medicine is used to treat allergy symptoms. It is also used to treat anxiety and tension. This medicine can be used with other medicines to induce sleep before surgery. This medicine may be used for other purposes; ask your health care provider or pharmacist if you have questions. COMMON BRAND NAME(S): ANX, Atarax, Rezine, Vistaril What should I tell my health care provider before I take this medicine? They need to know if you have any of these conditions:  glaucoma  heart disease  history of irregular heartbeat  kidney disease  liver disease  lung or breathing disease, like asthma  stomach or intestine problems  thyroid disease  trouble passing urine  an unusual or allergic reaction to hydroxyzine, cetirizine, other medicines, foods, dyes or preservatives  pregnant or trying to get pregnant  breast-feeding How should I use this medicine? Take this medicine by mouth with a full glass of water. Follow the directions on the prescription label. You may take this medicine with food or on an empty stomach. Take your medicine at regular intervals. Do not take your medicine more often than directed. Talk to your pediatrician regarding the use of this medicine in children. Special care may be needed. While this drug may be prescribed for children as young as 73 years of age for selected conditions, precautions do apply. Patients over 36 years old may have a stronger reaction and need a smaller dose. Overdosage: If you think you have taken too much of this medicine contact a poison control center or emergency room at once. NOTE: This medicine is only for you. Do not share this medicine with others. What if I miss a dose? If you miss a dose, take it as soon as you can. If it is almost time for your next dose, take only that dose. Do not take double or extra doses. What may  interact with this medicine? Do not take this medicine with any of the following medications:  cisapride  dronedarone  pimozide  thioridazine This medicine may also interact with the following medications:  alcohol  antihistamines for allergy, cough, and cold  atropine  barbiturate medicines for sleep or seizures, like phenobarbital  certain antibiotics like erythromycin or clarithromycin  certain medicines for anxiety or sleep  certain medicines for bladder problems like oxybutynin, tolterodine  certain medicines for depression or psychotic disturbances  certain medicines for irregular heart beat  certain medicines for Parkinson's disease like benztropine, trihexyphenidyl  certain medicines for seizures like phenobarbital, primidone  certain medicines for stomach problems like dicyclomine, hyoscyamine  certain medicines for travel sickness like scopolamine  ipratropium  narcotic medicines for pain  other medicines that prolong the QT interval (an abnormal heart rhythm) like dofetilide This list may not describe all possible interactions. Give your health care provider a list of all the medicines, herbs, non-prescription drugs, or dietary supplements you use. Also tell them if you smoke, drink alcohol, or use illegal drugs. Some items may interact with your medicine. What should I watch for while using this medicine? Tell your doctor or health care professional if your symptoms do not improve. You may get drowsy or dizzy. Do not drive, use machinery, or do anything that needs mental alertness until you know how this medicine affects you. Do not stand or sit up quickly, especially if you are an older patient. This reduces the risk of dizzy or fainting spells. Alcohol may  interfere with the effect of this medicine. Avoid alcoholic drinks. Your mouth may get dry. Chewing sugarless gum or sucking hard candy, and drinking plenty of water may help. Contact your doctor if the  problem does not go away or is severe. This medicine may cause dry eyes and blurred vision. If you wear contact lenses you may feel some discomfort. Lubricating drops may help. See your eye doctor if the problem does not go away or is severe. If you are receiving skin tests for allergies, tell your doctor you are using this medicine. What side effects may I notice from receiving this medicine? Side effects that you should report to your doctor or health care professional as soon as possible:  allergic reactions like skin rash, itching or hives, swelling of the face, lips, or tongue  changes in vision  confusion  fast, irregular heartbeat  seizures  tremor  trouble passing urine or change in the amount of urine Side effects that usually do not require medical attention (report to your doctor or health care professional if they continue or are bothersome):  constipation  drowsiness  dry mouth  headache  tiredness This list may not describe all possible side effects. Call your doctor for medical advice about side effects. You may report side effects to FDA at 1-800-FDA-1088. Where should I keep my medicine? Keep out of the reach of children. Store at room temperature between 15 and 30 degrees C (59 and 86 degrees F). Keep container tightly closed. Throw away any unused medicine after the expiration date. NOTE: This sheet is a summary. It may not cover all possible information. If you have questions about this medicine, talk to your doctor, pharmacist, or health care provider.  2021 Elsevier/Gold Standard (2018-05-03 13:19:55)  

## 2020-07-30 ENCOUNTER — Telehealth: Payer: Self-pay | Admitting: *Deleted

## 2020-07-30 ENCOUNTER — Encounter: Payer: Self-pay | Admitting: Family Medicine

## 2020-07-30 LAB — TIQ-NTM

## 2020-07-30 MED ORDER — VANCOMYCIN HCL 125 MG PO CAPS: 125.0000 mg | ORAL_CAPSULE | Freq: Four times a day (QID) | ORAL | 0 refills | Status: DC

## 2020-07-30 NOTE — Progress Notes (Signed)
MyChart message sent - Gabriela Howard, your stool sample did come back positive for C. Diff like we suspected. I am sending in an antibiotic for you. Please be sure everyone in your house is frequently washing hands with soap and water (hand sanitizer is not adequate), use a separate bathroom if you are able. Do your best to stay well hydrated. Let us know if you need anything else. I hope you feel better soon!

## 2020-07-30 NOTE — Telephone Encounter (Signed)
Patient called this morning 07/30/20, spoke with Janett Billow at the [facility]. Asked to speak to the office manager regarding how she was treated on her last visit on 07/27/20.  Janett Billow forwarded the call to Everrett Coombe. I spoke with the patient and she was upset that she could possibly be charged for an additional lab fee, for the stool sample she brought in, after being told by Levada Dy in Triage,  her sample was insufficient and an additional sample would be needed to complete the test results. Patient assumed, the sample she brought in was actually the sample tested. I spoke with Amber to follow up with Caleen Jobs, DNP, who saw the patient to obtain a clear understanding of what actually took place and our best practices.  Amber will contact the patient in this regard.

## 2020-07-30 NOTE — Progress Notes (Signed)
Addendum to appointment note 07/28/20:  Friday afternoon, office was notified that stool sample the patient brought in from home was inadequate. Shaune Pascal, CMA informed this provider she was calling the patient to inform her that she would need to pick up a stool specimen kit from the lab and return with an adequate sample. Patient had been previously notified by me during the appointment that the sample turn around would take awhile and that we would let her know results once they came in, but it would likely be over the weekend. She was instructed to let the surgeons office know what was going on. Patient was agreeable to plan.

## 2020-07-30 NOTE — Telephone Encounter (Signed)
Pt called this morning upset about a situation.  Pt spoke to [facility].  I tried calling the pt to get some more information but the phone just rang and there was no answer.

## 2020-07-30 NOTE — Addendum Note (Signed)
Addended by: Caleen Jobs B on: 07/30/2020 08:22 AM   Modules accepted: Orders

## 2020-07-30 NOTE — Telephone Encounter (Signed)
Transition Care Management Follow-up Telephone Call  Date of discharge and from where: [facility]  How have you been since you were released from the hospital? Feeling better  Any questions or concerns? No  Items Reviewed:  Did the pt receive and understand the discharge instructions provided? Yes   Medications obtained and verified? Yes   Other? No   Any new allergies since your discharge? No   Dietary orders reviewed? no  Do you have support at home? Yes    Functional Questionnaire: (I = Independent and D = Dependent) ADLs: I      Bathing/Dressing- I   Meal Prep- I  Eating- I  Maintaining continence- I  Transferring/Ambulation- I  Managing Meds- I  Follow up appointments reviewed:   PCP Hospital f/u appt confirmed? No    Specialist Hospital f/u appt confirmed? No   Are transportation arrangements needed? No   If their condition worsens, is the pt aware to call PCP or go to the Emergency Dept.? yes  Was the patient provided with contact information for the PCP's office or ED? yes  Was to pt encouraged to call back with questions or concerns? yes

## 2020-07-31 LAB — AEROBIC CULTURE

## 2020-07-31 LAB — C. DIFFICILE GDH AND TOXIN A/B
GDH ANTIGEN: DETECTED
MICRO NUMBER:: 11614250
SPECIMEN QUALITY:: ADEQUATE
TOXIN A AND B: DETECTED

## 2020-08-01 ENCOUNTER — Encounter: Payer: Self-pay | Admitting: Osteopathic Medicine

## 2020-08-02 ENCOUNTER — Other Ambulatory Visit: Payer: Self-pay

## 2020-08-02 ENCOUNTER — Encounter: Payer: Self-pay | Admitting: Osteopathic Medicine

## 2020-08-02 ENCOUNTER — Ambulatory Visit (INDEPENDENT_AMBULATORY_CARE_PROVIDER_SITE_OTHER): Payer: No Typology Code available for payment source | Admitting: Osteopathic Medicine

## 2020-08-02 VITALS — BP 126/69 | HR 68 | Temp 97.9°F | Wt 171.1 lb

## 2020-08-02 DIAGNOSIS — A498 Other bacterial infections of unspecified site: Secondary | ICD-10-CM | POA: Diagnosis not present

## 2020-08-02 NOTE — Telephone Encounter (Signed)
Patient has been seen, appointment already made.

## 2020-08-02 NOTE — Progress Notes (Signed)
Gabriela Howard is a 60 y.o. female who presents to  [place] at [facility]  today, 08/02/20, seeking care for the following:  . Concern for upcoming surgery -has been delayed a few times, recently Dx C-Diff, she is on Day 6 po vancomycin and notes some improvement but not complete resolution of diarrhea, not totally liquid stool but often liquid, not much formed stool. She reports er orthopedist has requested TOC.      ASSESSMENT & PLAN with other pertinent findings:  The encounter diagnosis was Clostridium difficile infection.    Test of cure not advised given false(+) rate  No medical contraindications to surgery  Would advise 2nd opinion from another ortho if they are insisting on TOC. Need to determine risk vs benefit of delaying surgery further and come to a shared decision with the patient.   Letter written for surgeon, pt given copy and we faxed copy to [facility]     There are no Patient Instructions on file for this visit.  No orders of the defined types were placed in this encounter.   No orders of the defined types were placed in this encounter.    See below for relevant physical exam findings  See below for recent lab and imaging results reviewed  Medications, allergies, PMH, PSH, SocH, FamH reviewed below    Follow-up instructions: Return if symptoms worsen or fail to improve.                                        Exam:  BP 126/69 (BP Location: Left Arm, Patient Position: Sitting, Cuff Size: Normal)   Pulse 68   Temp 97.9 F (36.6 C) (Oral)   Wt 171 lb 1.3 oz (77.6 kg)   LMP 04/05/2016   BMI 30.31 kg/m   Constitutional: VS see above. General Appearance: alert, well-developed, well-nourished, NAD  Neck: No masses, trachea midline.   Respiratory: Normal respiratory effort.  Neurological: Normal balance/coordination. No tremor.  Skin: warm, dry, intact.    Psychiatric: Normal judgment/insight. Normal mood and affect. Oriented x3.   Current Meds  Medication Sig  . Ascorbic Acid (VITAMIN C) 1000 MG tablet Take 1,000 mg by mouth daily.   . Cholecalciferol (VITAMIN D) 2000 units CAPS Take 1 capsule by mouth daily.  . Collagen Hydrolysate POWD by Does not apply route.   . hydrOXYzine (ATARAX/VISTARIL) 50 MG tablet Take 1 tablet (50 mg total) by mouth 3 (three) times daily as needed for anxiety.  Marland Kitchen ibuprofen (ADVIL) 800 MG tablet Take 1 tablet (800 mg total) by mouth every 8 (eight) hours as needed.  . Magnesium 500 MG CAPS Take 1 capsule by mouth daily.   . metoprolol succinate (TOPROL-XL) 50 MG 24 hr tablet Take 1 tablet (50 mg total) by mouth daily. Take with or immediately following a meal.  . vancomycin (VANCOCIN) 125 MG capsule Take 1 capsule (125 mg total) by mouth 4 (four) times daily for 10 days.  . vancomycin (VANCOCIN) 125 MG capsule Take 125 mg by mouth in the morning, at noon, in the evening, and at bedtime. For 10 days    Allergies  Allergen Reactions  . Sulfa Antibiotics Other (See Comments) and Hives    Reaction:  Unknown  Other reaction(s): Other (See Comments) Reaction: Unknown    . Aleve [Naproxen Sodium] Other (See Comments)    Reaction:  GI upset   . Other Nausea Only    MEDICATION TO NUMB GUMS FOR DEEP CLEANING LIGHTHEADNESS, AND JITTERY PER PT.     Patient Active Problem List   Diagnosis Date Noted  . Open wound of finger of right hand due to dog bite 07/17/2020  . Hypertension 07/13/2020  . Atherosclerosis 07/13/2020  . Anxiety 07/13/2020  . Injury of foot, right 05/17/2020  . Hyperlipidemia 05/04/2020  . Postoperative fever 05/07/2016  . Atypical squamous cells of undetermined significance (ASCUS) on Papanicolaou smear of cervix 03/23/2014  . Insomnia 01/22/2011  . Vitamin deficiency syndrome 01/22/2011    Family History  Problem Relation Age of Onset  . Thyroid disease Mother        Blood clots   . Hypertension Mother   . Heart disease Father   . Alzheimer's disease Father   . High Cholesterol Father   . Hyperlipidemia Brother   . Prostate cancer Maternal Uncle     Social History   Tobacco Use  Smoking Status Former Smoker  . Packs/day: 1.00  . Years: 12.00  . Pack years: 12.00  . Types: Cigarettes  . Quit date: 34  . Years since quitting: 28.2  Smokeless Tobacco Never Used    Past Surgical History:  Procedure Laterality Date  . ABDOMINAL HYSTERECTOMY  05/03/2016   LAVH, total  . WRIST SURGERY      Immunization History  Administered Date(s) Administered  . Influenza-Unspecified 04/01/2012, 03/24/2016  . PFIZER(Purple Top)SARS-COV-2 Vaccination 09/17/2019, 10/08/2019, 12/25/2019  . Tdap 09/18/2011, 07/12/2020  . Zoster Recombinat (Shingrix) 11/11/2019    Recent Results (from the past 2160 hour(s))  C. difficile GDH and Toxin A/B     Status: Abnormal   Collection Time: 07/27/20 11:29 AM  Result Value Ref Range   MICRO NUMBER: [id number]    SPECIMEN QUALITY: Adequate    Source STOOL    STATUS: FINAL    GDH ANTIGEN Detected     Comment: Detected   TOXIN A AND B Detected     Comment: Detected   COMMENT (A)     Toxigenic C. difficile detected For additional information, please refer to http://education.[facility].com/faq/FAQ136 (This link is being provided for informational/educational purposes only.)  Aerobic culture     Status: None   Collection Time: 07/27/20 11:29 AM   Specimen: Stool  Result Value Ref Range   MICRO NUMBER: CANCELED     Comment: Result canceled by the ancillary.   SPECIMEN QUALITY: CANCELED     Comment: Result canceled by the ancillary.   SOURCE: CANCELED     Comment: Result canceled by the ancillary.   STATUS: CANCELED     Comment: Result canceled by the ancillary.   AER RESULT: CANCELED     Comment: Result canceled by the ancillary.  TIQ-NTM     Status: None   Collection Time: 07/27/20 11:29 AM  Result Value Ref Range    QUESTION/PROBLEM:      Comment: . The specimen submitted is not appropriate for the test(s) ordered. Marland Kitchen    SPECIMEN(S) RECEIVED: Specimen RS unneeded     Comment: REQUESTED INFORMATION _________________________________ . AUTHORIZED SIGNATURE __________________________________ . TO PREVENT FURTHER DELAYS IN TESTING, PLEASE COMPLETE INFORMATION ABOVE AND EITHER FAX TO [phone] OR  EMAIL TO [email or web address] TO  RESOLVE THIS ORDER.     No results found.     All questions at time of visit were answered - patient instructed to contact office with any additional concerns or updates. ER/RTC precautions were reviewed  with the patient as applicable.   Please note: manual typing as well as voice recognition software may have been used to produce this document - typos may escape review. Please contact Dr. Sheppard Coil for any needed clarifications.   Total encounter time on date of service, 08/02/20, was 30 minutes spent addressing problems/issues as noted above in Assessment & Plan, including time spent in discussion with patient regarding the HPI, ROS, confirming history, reviewing Assessment & Plan, as well as time spent on coordination of care w/ orthopedics, record review, review literature re: TOC Cdiff.

## 2020-08-08 ENCOUNTER — Encounter: Payer: Self-pay | Admitting: Osteopathic Medicine

## 2020-08-08 ENCOUNTER — Other Ambulatory Visit: Payer: Self-pay

## 2020-08-08 ENCOUNTER — Ambulatory Visit: Payer: No Typology Code available for payment source | Admitting: Osteopathic Medicine

## 2020-08-08 VITALS — BP 134/80 | HR 64 | Temp 98.2°F | Wt 171.0 lb

## 2020-08-08 DIAGNOSIS — I10 Essential (primary) hypertension: Secondary | ICD-10-CM

## 2020-08-08 MED ORDER — VALSARTAN 80 MG PO TABS: 80.0000 mg | ORAL_TABLET | Freq: Every day | ORAL | 1 refills | Status: DC

## 2020-08-08 NOTE — Progress Notes (Signed)
Gabriela Howard is a 60 y.o. female who presents to  [place] at [facility]  today, 08/08/20, seeking care for the following:  . Concern for BP elevated at home SBP 150-160. No CP/SOB, No HA/VC, no dizziness. Checking in AM around 10:00. Not checking the rest of the day. Arm cuff, previously verified in office. BP at goal in office. Concerned metoprolol might not be beneficial, was on this to help some w/ anxiety also. NO hx palpitations.   BP Readings from Last 3 Encounters:  08/08/20 134/80  08/02/20 126/69  07/27/20 118/80      ASSESSMENT & PLAN with other pertinent findings:  The encounter diagnosis was Hypertension, unspecified type.    Patient Instructions  Goal BP <130/80 Definitely stay <140/90 If higher than 160/100 sit 5 mins and recheck     No orders of the defined types were placed in this encounter.   Meds ordered this encounter  Medications  . valsartan (DIOVAN) 80 MG tablet    Sig: Take 1 tablet (80 mg total) by mouth daily.    Dispense:  90 tablet    Refill:  1     See below for relevant physical exam findings  See below for recent lab and imaging results reviewed  Medications, allergies, PMH, PSH, SocH, FamH reviewed below    Follow-up instructions: Return for FOLLOW UP VIA MYCHART MESSAGE (SET TO SEND IN 1 WEEK RE: BP AT [place]). ANNUAL WHEN DUE.                                        Exam:  BP 134/80 (BP Location: Left Arm, Patient Position: Sitting, Cuff Size: Normal)   Pulse 64   Temp 98.2 F (36.8 C) (Oral)   Wt 171 lb 0.6 oz (77.6 kg)   LMP 04/05/2016   BMI 30.30 kg/m   Constitutional: VS see above. General Appearance: alert, well-developed, well-nourished, NAD  Neck: No masses, trachea midline.   Respiratory: Normal respiratory effort. no wheeze, no rhonchi, no rales  Cardiovascular: S1/S2 normal, no murmur, no rub/gallop auscultated. RRR.    Musculoskeletal: Gait normal. Symmetric and independent movement of all extremities  Neurological: Normal balance/coordination. No tremor.  Skin: warm, dry, intact.   Psychiatric: Normal judgment/insight. Normal mood and affect. Oriented x3.   Current Meds  Medication Sig  . Ascorbic Acid (VITAMIN C) 1000 MG tablet Take 1,000 mg by mouth daily.   . Cholecalciferol (VITAMIN D) 2000 units CAPS Take 1 capsule by mouth daily.  . Collagen Hydrolysate POWD by Does not apply route.   . hydrOXYzine (ATARAX/VISTARIL) 50 MG tablet Take 1 tablet (50 mg total) by mouth 3 (three) times daily as needed for anxiety.  Marland Kitchen ibuprofen (ADVIL) 800 MG tablet Take 1 tablet (800 mg total) by mouth every 8 (eight) hours as needed.  . Magnesium 500 MG CAPS Take 1 capsule by mouth daily.   . valsartan (DIOVAN) 80 MG tablet Take 1 tablet (80 mg total) by mouth daily.  . vancomycin (VANCOCIN) 125 MG capsule Take 1 capsule (125 mg total) by mouth 4 (four) times daily for 10 days.  Marland Kitchen zolpidem (AMBIEN) 5 MG tablet Take 5 mg by mouth at bedtime as needed for sleep.  . [DISCONTINUED] metoprolol succinate (TOPROL-XL) 50 MG 24 hr tablet Take 1 tablet (50 mg total) by mouth daily. Take with  or immediately following a meal.    Allergies  Allergen Reactions  . Aleve [Naproxen] Nausea And Vomiting and Nausea Only    Other reaction(s): Dizziness, sick,  Reaction: GI upset   . Sulfa Antibiotics Hives, Nausea Only and Other (See Comments)    Reaction:  Unknown  Other reaction(s): Dizziness  . Aleve [Naproxen Sodium] Other (See Comments)    Reaction:  GI upset   . Sulfamethoxazole-Trimethoprim     Other reaction(s): hives  . Other Nausea Only    MEDICATION TO NUMB GUMS FOR DEEP CLEANING LIGHTHEADNESS, AND JITTERY PER PT.     Patient Active Problem List   Diagnosis Date Noted  . Open wound of finger of right hand due to dog bite 07/17/2020  . Hypertension 07/13/2020  . Atherosclerosis 07/13/2020  . Anxiety  07/13/2020  . Injury of foot, right 05/17/2020  . Hyperlipidemia 05/04/2020  . Postoperative fever 05/07/2016  . Atypical squamous cells of undetermined significance (ASCUS) on Papanicolaou smear of cervix 03/23/2014  . Insomnia 01/22/2011  . Vitamin deficiency syndrome 01/22/2011    Family History  Problem Relation Age of Onset  . Thyroid disease Mother        Blood clots  . Hypertension Mother   . Heart disease Father   . Alzheimer's disease Father   . High Cholesterol Father   . Hyperlipidemia Brother   . Prostate cancer Maternal Uncle     Social History   Tobacco Use  Smoking Status Former Smoker  . Packs/day: 1.00  . Years: 12.00  . Pack years: 12.00  . Types: Cigarettes  . Quit date: 25  . Years since quitting: 28.2  Smokeless Tobacco Never Used    Past Surgical History:  Procedure Laterality Date  . ABDOMINAL HYSTERECTOMY  05/03/2016   LAVH, total  . WRIST SURGERY      Immunization History  Administered Date(s) Administered  . Influenza-Unspecified 04/01/2012, 03/24/2016  . PFIZER(Purple Top)SARS-COV-2 Vaccination 09/17/2019, 10/08/2019, 12/25/2019  . Tdap 09/18/2011, 07/12/2020  . Zoster Recombinat (Shingrix) 11/11/2019    Recent Results (from the past 2160 hour(s))  C. difficile GDH and Toxin A/B     Status: Abnormal   Collection Time: 07/27/20 11:29 AM  Result Value Ref Range   MICRO NUMBER: [id number]    SPECIMEN QUALITY: Adequate    Source STOOL    STATUS: FINAL    GDH ANTIGEN Detected     Comment: Detected   TOXIN A AND B Detected     Comment: Detected   COMMENT (A)     Toxigenic C. difficile detected For additional information, please refer to http://education.[facility].com/faq/FAQ136 (This link is being provided for informational/educational purposes only.)  Aerobic culture     Status: None   Collection Time: 07/27/20 11:29 AM   Specimen: Stool  Result Value Ref Range   MICRO NUMBER: CANCELED     Comment: Result canceled by the  ancillary.   SPECIMEN QUALITY: CANCELED     Comment: Result canceled by the ancillary.   SOURCE: CANCELED     Comment: Result canceled by the ancillary.   STATUS: CANCELED     Comment: Result canceled by the ancillary.   AER RESULT: CANCELED     Comment: Result canceled by the ancillary.  TIQ-NTM     Status: None   Collection Time: 07/27/20 11:29 AM  Result Value Ref Range   QUESTION/PROBLEM:      Comment: . The specimen submitted is not appropriate for the test(s) ordered. Marland Kitchen  SPECIMEN(S) RECEIVED: Specimen RS unneeded     Comment: REQUESTED INFORMATION _________________________________ . AUTHORIZED SIGNATURE __________________________________ . TO PREVENT FURTHER DELAYS IN TESTING, PLEASE COMPLETE INFORMATION ABOVE AND EITHER FAX TO [phone] OR  EMAIL TO [email or web address] TO  RESOLVE THIS ORDER.     No results found.     All questions at time of visit were answered - patient instructed to contact office with any additional concerns or updates. ER/RTC precautions were reviewed with the patient as applicable.   Please note: manual typing as well as voice recognition software may have been used to produce this document - typos may escape review. Please contact Dr. Sheppard Coil for any needed clarifications.

## 2020-08-08 NOTE — Patient Instructions (Signed)
Goal BP <130/80 Definitely stay <140/90 If higher than 160/100 sit 5 mins and recheck

## 2020-08-20 MED ORDER — VALSARTAN 80 MG PO TABS: 160.0000 mg | ORAL_TABLET | Freq: Every day | ORAL | 1 refills | Status: DC

## 2020-08-22 ENCOUNTER — Ambulatory Visit (INDEPENDENT_AMBULATORY_CARE_PROVIDER_SITE_OTHER): Payer: No Typology Code available for payment source | Admitting: Osteopathic Medicine

## 2020-08-22 ENCOUNTER — Encounter: Payer: Self-pay | Admitting: Osteopathic Medicine

## 2020-08-22 ENCOUNTER — Other Ambulatory Visit: Payer: Self-pay

## 2020-08-22 VITALS — BP 129/77 | HR 88 | Temp 99.0°F | Wt 170.1 lb

## 2020-08-22 DIAGNOSIS — R197 Diarrhea, unspecified: Secondary | ICD-10-CM

## 2020-08-22 DIAGNOSIS — A0472 Enterocolitis due to Clostridium difficile, not specified as recurrent: Secondary | ICD-10-CM | POA: Insufficient documentation

## 2020-08-22 DIAGNOSIS — Z9889 Other specified postprocedural states: Secondary | ICD-10-CM | POA: Insufficient documentation

## 2020-08-22 DIAGNOSIS — S62309A Unspecified fracture of unspecified metacarpal bone, initial encounter for closed fracture: Secondary | ICD-10-CM | POA: Insufficient documentation

## 2020-08-22 DIAGNOSIS — Z8781 Personal history of (healed) traumatic fracture: Secondary | ICD-10-CM | POA: Insufficient documentation

## 2020-08-22 MED ORDER — VANCOMYCIN HCL 125 MG PO CAPS: 125.0000 mg | ORAL_CAPSULE | Freq: Four times a day (QID) | ORAL | 0 refills | Status: DC

## 2020-08-22 NOTE — Addendum Note (Signed)
Addended by: Maryla Morrow on: 08/22/2020 01:48 PM   Modules accepted: Orders

## 2020-08-22 NOTE — Progress Notes (Addendum)
Gabriela Howard is a 60 y.o. female who presents to  [place] at [facility]  today, 08/22/20, seeking care for the following:  Diarrhea recurrence, pt concerned for C-diff.  . Suffered dog bite to R hand 07/07/20, initial ED visit for this on 07/12/20 at which point Dx w/ fracture of metacarpal and injury of tendon. Treated w/ Augmentin 500-125 tid x10 days. Started having diarrhea in first 2 days of taking Abx, it improved then came back around beginning of 07/2020.  . (+)CDiff PCR 07/28/20. Was treated w/ po vancomycin x10 days starting 07/28/20. CDiff delayed surgical intervention on wrist/hand fracture.  . Date of surgery was 08/09/20 for ORIF R 1st metacarpal as well as tendon repair. Received piperacillin-tazobactam intraoperatively.  . Doing well postop until wound infection, initial treatment w/ Keflex 500 mg tid started Friday 07/28/20. Several episodes of diarrhea per day starting about 08/18/20 (this weekend). Abx changed this Monday 08/20/20 (2 days ago) to Bactrim DS bid. Pt reports persistent loose stool, occasionally formed stool but rarely, not same cramping as was associated w/ C-diff stool.  . Last night Tmax to 102 F, took Tylenol last dose today 08/22/20 at 04:30. Has not taken Hydrocodone-APAP since yesterday, temp in office 99.8 F. Pt reports no increased pain/redness to surgical wound.       ASSESSMENT & PLAN with other pertinent findings:  The encounter diagnosis was Diarrhea, unspecified type: Ddx recurrent C-diff, antibiotic effect, other.   Will reach out to ID - ideally would like to get patient seen in their clinic ASAP re: testing vs presumptive treatment for CDiff, monitoring for presumptive antibiotic effect, appropriate abx therapy for surgical wound, etc. Dr Gabriela Howard on call per [facility], I reached out to him w/ message 08/22/20 11:04 AM he messaged me back quickly and let me know he'd work on getting [name] seen in his  office today or tomorrow and ok to give po vanc now. I called patient 1:48 PM and she said she was playing a bit pf phone tag w/ ID office, I let her know I sent vanc and all questions were answered   ER PRECAUTIONS REVIEWED, [place] ordered this encounter  Medications  . vancomycin (VANCOCIN) 125 MG capsule    Sig: Take 1 capsule (125 mg total) by mouth 4 (four) times daily for 10 days.    Dispense:  40 capsule    Refill:  0        See below for relevant physical exam findings  See below for recent lab and imaging results reviewed  Medications, allergies, PMH, PSH, SocH, FamH reviewed below    Follow-up instructions: Return for RECHECK PENDING ID RECOMMENDATIONS / IF WORSE OR CHANGE.                                        Exam:  BP 129/77 (BP Location: Left Arm, Patient Position: Sitting, Cuff Size: Normal)   Pulse 88   Temp 99 F (37.2 C) (Oral)   Wt 170 lb 1.9 oz (77.2 kg)   LMP 04/05/2016   BMI 30.14 kg/m   Constitutional: VS see above. General Appearance: alert, well-developed, well-nourished, NAD  Neck: No masses, trachea midline.   Respiratory: Normal respiratory effort. no wheeze, no rhonchi, no rales  Cardiovascular: S1/S2 normal, no murmur, no rub/gallop auscultated.  RRR.   Musculoskeletal: Gait normal.   Neurological: Normal balance/coordination. No tremor.  Skin: warm, dry, intact.   Psychiatric: Normal judgment/insight. Normal mood and affect. Oriented x3.   Current Meds  Medication Sig  . Ascorbic Acid (VITAMIN C) 1000 MG tablet Take 1,000 mg by mouth daily.   . Cholecalciferol (VITAMIN D) 2000 units CAPS Take 1 capsule by mouth daily.  . Collagen Hydrolysate POWD by Does not apply route.   . hydrOXYzine (ATARAX/VISTARIL) 50 MG tablet Take 1 tablet (50 mg total) by mouth 3 (three) times daily as needed for anxiety.  Marland Kitchen ibuprofen (ADVIL) 800 MG  tablet Take 1 tablet (800 mg total) by mouth every 8 (eight) hours as needed.  . Magnesium 500 MG CAPS Take 1 capsule by mouth daily.   Marland Kitchen sulfamethoxazole-trimethoprim (BACTRIM DS) 800-160 MG tablet Take 1 tablet by mouth 2 (two) times daily.  . valsartan (DIOVAN) 80 MG tablet Take 2 tablets (160 mg total) by mouth daily.  Marland Kitchen zolpidem (AMBIEN) 5 MG tablet Take 5 mg by mouth at bedtime as needed for sleep.    Allergies  Allergen Reactions  . Aleve [Naproxen] Nausea And Vomiting and Nausea Only    Other reaction(s): Dizziness, sick,  Reaction: GI upset   . Sulfa Antibiotics Hives, Nausea Only and Other (See Comments)    Reaction:  Unknown  Other reaction(s): Dizziness  . Aleve [Naproxen Sodium] Other (See Comments)    Reaction:  GI upset   . Sulfamethoxazole-Trimethoprim     Other reaction(s): hives  . Other Nausea Only    MEDICATION TO NUMB GUMS FOR DEEP CLEANING LIGHTHEADNESS, AND JITTERY PER PT.     Patient Active Problem List   Diagnosis Date Noted  . Open wound of finger of right hand due to dog bite 07/17/2020  . Hypertension 07/13/2020  . Atherosclerosis 07/13/2020  . Anxiety 07/13/2020  . Injury of foot, right 05/17/2020  . Hyperlipidemia 05/04/2020  . Postoperative fever 05/07/2016  . Atypical squamous cells of undetermined significance (ASCUS) on Papanicolaou smear of cervix 03/23/2014  . Insomnia 01/22/2011  . Vitamin deficiency syndrome 01/22/2011    Family History  Problem Relation Age of Onset  . Thyroid disease Mother        Blood clots  . Hypertension Mother   . Heart disease Father   . Alzheimer's disease Father   . High Cholesterol Father   . Hyperlipidemia Brother   . Prostate cancer Maternal Uncle     Social History   Tobacco Use  Smoking Status Former Smoker  . Packs/day: 1.00  . Years: 12.00  . Pack years: 12.00  . Types: Cigarettes  . Quit date: 28  . Years since quitting: 28.2  Smokeless Tobacco Never Used    Past Surgical  History:  Procedure Laterality Date  . ABDOMINAL HYSTERECTOMY  05/03/2016   LAVH, total  . WRIST SURGERY      Immunization History  Administered Date(s) Administered  . Influenza-Unspecified 04/01/2012, 03/24/2016  . PFIZER(Purple Top)SARS-COV-2 Vaccination 09/17/2019, 10/08/2019, 12/25/2019  . Tdap 09/18/2011, 07/12/2020  . Zoster Recombinat (Shingrix) 11/11/2019    Recent Results (from the past 2160 hour(s))  C. difficile GDH and Toxin A/B     Status: Abnormal   Collection Time: 07/27/20 11:29 AM  Result Value Ref Range   MICRO NUMBER: [id number]    SPECIMEN QUALITY: Adequate    Source STOOL    STATUS: FINAL    GDH ANTIGEN Detected     Comment: Detected   TOXIN  A AND B Detected     Comment: Detected   COMMENT (A)     Toxigenic C. difficile detected For additional information, please refer to http://education.[facility].com/faq/FAQ136 (This link is being provided for informational/educational purposes only.)  Aerobic culture     Status: None   Collection Time: 07/27/20 11:29 AM   Specimen: Stool  Result Value Ref Range   MICRO NUMBER: CANCELED     Comment: Result canceled by the ancillary.   SPECIMEN QUALITY: CANCELED     Comment: Result canceled by the ancillary.   SOURCE: CANCELED     Comment: Result canceled by the ancillary.   STATUS: CANCELED     Comment: Result canceled by the ancillary.   AER RESULT: CANCELED     Comment: Result canceled by the ancillary.  TIQ-NTM     Status: None   Collection Time: 07/27/20 11:29 AM  Result Value Ref Range   QUESTION/PROBLEM:      Comment: . The specimen submitted is not appropriate for the test(s) ordered. Marland Kitchen    SPECIMEN(S) RECEIVED: Specimen RS unneeded     Comment: REQUESTED INFORMATION _________________________________ . AUTHORIZED SIGNATURE __________________________________ . TO PREVENT FURTHER DELAYS IN TESTING, PLEASE COMPLETE INFORMATION ABOVE AND EITHER FAX TO [phone] OR  EMAIL TO  [email or web address] TO  RESOLVE THIS ORDER.     No results found.     All questions at time of visit were answered - patient instructed to contact office with any additional concerns or updates. ER/RTC precautions were reviewed with the patient as applicable.   Please note: manual typing as well as voice recognition software may have been used to produce this document - typos may escape review. Please contact Dr. Sheppard Coil for any needed clarifications.   Total encounter time on date of service, 08/22/20, was 40 minutes spent addressing problems/issues as noted above in [place], including time spent in discussion with patient regarding the HPI, ROS, confirming history, reviewing Assessment & Plan, as well as time spent on coordination of care, record review, communication w/ specialists.

## 2020-08-23 ENCOUNTER — Ambulatory Visit (INDEPENDENT_AMBULATORY_CARE_PROVIDER_SITE_OTHER): Payer: No Typology Code available for payment source | Admitting: Internal Medicine

## 2020-08-23 ENCOUNTER — Encounter: Payer: Self-pay | Admitting: Internal Medicine

## 2020-08-23 DIAGNOSIS — Z9889 Other specified postprocedural states: Secondary | ICD-10-CM

## 2020-08-23 DIAGNOSIS — L0889 Other specified local infections of the skin and subcutaneous tissue: Secondary | ICD-10-CM | POA: Diagnosis not present

## 2020-08-23 DIAGNOSIS — Z8781 Personal history of (healed) traumatic fracture: Secondary | ICD-10-CM

## 2020-08-23 DIAGNOSIS — S62234A Other nondisplaced fracture of base of first metacarpal bone, right hand, initial encounter for closed fracture: Secondary | ICD-10-CM

## 2020-08-23 DIAGNOSIS — A0472 Enterocolitis due to Clostridium difficile, not specified as recurrent: Secondary | ICD-10-CM

## 2020-08-23 NOTE — Progress Notes (Signed)
[place] for Infectious Disease  Reason for Consult: Right hand infection and C. difficile colitis Referring Provider: Dr. Emeterio Reeve  Assessment: She developed right hand infection following a dog bite wound 1 month ago.  I suspect that her recent worsening was due to persistence of that infection.  She has shown clinical improvement on methenamine sulfamethoxazole and I agree with having her continue it for now.  Her recent antibiotic therapy is complicated by a C. difficile diarrhea.  She feels like she is improving on her second round of oral vancomycin so I will have her continue it while she is on trimethoprim sulfamethoxazole and for 1 week after stopping it.  Plan: 1. Continue oral trimethoprim sulfamethoxazole and vancomycin 2. Follow-up in 1 week  Patient Active Problem List   Diagnosis Date Noted  . C. difficile diarrhea 08/22/2020    Priority: High  . Metacarpal bone fracture 08/22/2020    Priority: Medium  . S/P ORIF (open reduction internal fixation) fracture 08/22/2020    Priority: Medium  . Open wound of finger of right hand due to dog bite 07/17/2020    Priority: Medium  . Hypertension 07/13/2020  . Atherosclerosis 07/13/2020  . Anxiety 07/13/2020  . Injury of foot, right 05/17/2020  . Hyperlipidemia 05/04/2020  . Atypical squamous cells of undetermined significance (ASCUS) on Papanicolaou smear of cervix 03/23/2014  . Insomnia 01/22/2011  . Vitamin deficiency syndrome 01/22/2011    Patient's Medications  New Prescriptions   No medications on file  Previous Medications   ASCORBIC ACID (VITAMIN C) 1000 MG TABLET    Take 1,000 mg by mouth daily.    CHOLECALCIFEROL (VITAMIN D) 2000 UNITS CAPS    Take 1 capsule by mouth daily.   COLLAGEN HYDROLYSATE POWD    by Does not apply route.    HYDROCODONE-ACETAMINOPHEN (NORCO/VICODIN) 5-325 MG TABLET    Take 1 tablet by mouth every 8 (eight) hours as needed for moderate pain.   HYDROXYZINE  (ATARAX/VISTARIL) 50 MG TABLET    Take 1 tablet (50 mg total) by mouth 3 (three) times daily as needed for anxiety.   IBUPROFEN (ADVIL) 800 MG TABLET    Take 1 tablet (800 mg total) by mouth every 8 (eight) hours as needed.   MAGNESIUM 500 MG CAPS    Take 1 capsule by mouth daily.    MULTIPLE VITAMIN (MULTIVITAMIN WITH MINERALS) TABS TABLET    Take 1 tablet by mouth daily.   ONDANSETRON (ZOFRAN) 4 MG TABLET    Take 1 tablet (4 mg total) by mouth every 6 (six) hours.   ROSUVASTATIN (CRESTOR) 10 MG TABLET    Take 1 tablet (10 mg total) by mouth daily.   SULFAMETHOXAZOLE-TRIMETHOPRIM (BACTRIM DS) 800-160 MG TABLET    Take 1 tablet by mouth 2 (two) times daily.   VALSARTAN (DIOVAN) 80 MG TABLET    Take 2 tablets (160 mg total) by mouth daily.   VANCOMYCIN (VANCOCIN) 125 MG CAPSULE    Take 1 capsule (125 mg total) by mouth 4 (four) times daily for 10 days.   ZOLPIDEM (AMBIEN) 5 MG TABLET    Take 5 mg by mouth at bedtime as needed for sleep.  Modified Medications   No medications on file  Discontinued Medications   No medications on file    HPI: Gabriela Howard is a 60 y.o. female who was walking her dog in a local dog park on 07/07/2020.  She noticed a dog running loose  and attempted to catch the dog.  The dog had a collar and tags.  The dog bit her on her right hand.  She cleaned the area with soap and water but noticed increased pain, redness and swelling over the next few days.  She was seen in the emergency department on 07/12/2020.  X-rays revealed a comminuted fracture of the first metacarpal of her right hand.  She was given an IM injection of clindamycin and discharged on a 10-day course of oral amoxicillin clavulanate.  She was evaluated by orthopedic surgery the following day with a plan for eventual surgical fixation of the fracture.  She developed severe watery diarrhea on 07/27/2020 and was diagnosed with C. difficile diarrhea.  She started on oral vancomycin on 07/30/2020 and her diarrhea  slowly improved.  She underwent open reduction and internal fixation of her right thumb fracture on 08/09/2020.  She had a perioperative dose of piperacillin tazobactam.  She noticed increased redness and swelling over her right hand on 08/17/2020 and was started on oral cephalexin.  She did not notice any improvement in her right hand over the next 2 days.  She began to have more intermittent diarrhea and some nausea.  Cephalexin was changed to trimethoprim sulfamethoxazole on 08/20/2020.  She has had 2 fever spikes up to 101.9 degrees in the past 48 hours associated with chills and sweats but is feeling better now.  She has not had any diarrhea today and she feels like her right hand is definitely looking better since the change in antibiotics.  She was seen by her orthopedic team today remove some of her sutures.  They did express a small amount of bloody fluid from the base of her right thumb incision.  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  She is right-handed.  Review of Systems: Review of Systems  Constitutional: Positive for chills, diaphoresis, fever and malaise/fatigue.  Gastrointestinal: Positive for diarrhea and nausea. Negative for abdominal pain and vomiting.  Musculoskeletal: Positive for joint pain.  Skin: Negative for rash.      Past Medical History:  Diagnosis Date  . Anxiety   . Colon polyps   . Depression   . High cholesterol   . Insomnia   . Uterine fibroid     Social History   Tobacco Use  . Smoking status: Former Smoker    Packs/day: 1.00    Years: 12.00    Pack years: 12.00    Types: Cigarettes    Quit date: 1994    Years since quitting: 28.2  . Smokeless tobacco: Never Used  Vaping Use  . Vaping Use: Never used  Substance Use Topics  . Alcohol use: Yes    Alcohol/week: 1.0 standard drink    Types: 1 Standard drinks or equivalent per week    Comment: occ  . Drug use: No    Family History  Problem Relation Age of Onset  .  Thyroid disease Mother        Blood clots  . Hypertension Mother   . Heart disease Father   . Alzheimer's disease Father   . High Cholesterol Father   . Hyperlipidemia Brother   . Prostate cancer Maternal Uncle    Allergies  Allergen Reactions  . Aleve [Naproxen] Nausea And Vomiting and Nausea Only    Other reaction(s): Dizziness, sick,  Reaction: GI upset   . Sulfa Antibiotics Hives, Nausea Only and Other (See Comments)    Reaction:  Unknown  Other  reaction(s): Dizziness  . Aleve [Naproxen Sodium] Other (See Comments)    Reaction:  GI upset   . Sulfamethoxazole-Trimethoprim     Other reaction(s): hives  . Other Nausea Only    MEDICATION TO NUMB GUMS FOR DEEP CLEANING LIGHTHEADNESS, AND JITTERY PER PT.     OBJECTIVE: Vitals:   08/23/20 1030  Weight: 169 lb (76.7 kg)   Body mass index is 29.94 kg/m.   Physical Exam Constitutional:      Comments: Her spirits are good.  Cardiovascular:     Rate and Rhythm: Normal rate.  Pulmonary:     Effort: Pulmonary effort is normal.  Abdominal:     General: There is no distension.     Palpations: Abdomen is soft.     Tenderness: There is no abdominal tenderness. There is no guarding.  Musculoskeletal:     Comments: The sutures remain in on the dorsum of her right thumb.  Sutures over the dorsum of the hand have been removed this morning.  The redness and swelling looks significantly improved compared to a picture she has on her phone taken on 08/17/2020.  There is a small raised and slightly fluctuant red area in the webspace between her thumb and index finger where one of the puncture bite wounds occurred.  Psychiatric:        Mood and Affect: Mood normal.       Microbiology: No results found for this or any previous visit (from the past 240 hour(s)).  Michel Bickers, MD [facility] [place] [facility] [phone] pager   [phone] cell 08/23/2020, 10:32 AM

## 2020-08-28 ENCOUNTER — Other Ambulatory Visit: Payer: Self-pay

## 2020-08-28 ENCOUNTER — Telehealth (INDEPENDENT_AMBULATORY_CARE_PROVIDER_SITE_OTHER): Payer: No Typology Code available for payment source | Admitting: Internal Medicine

## 2020-08-28 DIAGNOSIS — A0472 Enterocolitis due to Clostridium difficile, not specified as recurrent: Secondary | ICD-10-CM | POA: Diagnosis not present

## 2020-08-28 NOTE — Progress Notes (Signed)
Virtual Visit via Telephone Note  I connected with Truman Hayward on 08/28/20 at  3:15 PM EDT by telephone and verified that I am speaking with the correct person using two identifiers.  Location: Patient: Home Provider: [facility]   I discussed the limitations, risks, security and privacy concerns of performing an evaluation and management service by telephone and the availability of in person appointments. I also discussed with the patient that there may be a patient responsible charge related to this service. The patient expressed understanding and agreed to proceed.   History of Present Illness: I spoke to [place] by phone today.  She called earlier stating that she was still having fever, chills and sweats at night.  She remains on trimethoprim sulfamethoxazole for her right hand infection and vancomycin for C. difficile diarrhea.  She says that her hand is looking much better.  She sent to pictures this morning that showed improvement.  She is not having any diarrhea.  She has not developed any generalized rash or itching.   Observations/Objective:   Assessment and Plan: I am not absolutely certain what is causing her persistent fevers but I am concerned that it may be drug fever due to Trimethoprim/Sulfamethoxazole since her right hand infection and diarrhea have improved.  Follow Up Instructions: Stop Trimethoprim/Sulfamethoxazole now Continue oral vancomycin Follow-up here on 08/30/2020   I discussed the assessment and treatment plan with the patient. The patient was provided an opportunity to ask questions and all were answered. The patient agreed with the plan and demonstrated an understanding of the instructions.   The patient was advised to call back or seek an in-person evaluation if the symptoms worsen or if the condition fails to improve as anticipated.  I provided 15 minutes of non-face-to-face time during this encounter.   Michel Bickers, MD

## 2020-08-30 ENCOUNTER — Other Ambulatory Visit: Payer: Self-pay

## 2020-08-30 ENCOUNTER — Ambulatory Visit: Payer: No Typology Code available for payment source | Admitting: Internal Medicine

## 2020-08-30 DIAGNOSIS — A0472 Enterocolitis due to Clostridium difficile, not specified as recurrent: Secondary | ICD-10-CM

## 2020-08-30 DIAGNOSIS — R509 Fever, unspecified: Secondary | ICD-10-CM | POA: Diagnosis not present

## 2020-08-30 DIAGNOSIS — S61451A Open bite of right hand, initial encounter: Secondary | ICD-10-CM

## 2020-08-30 DIAGNOSIS — L089 Local infection of the skin and subcutaneous tissue, unspecified: Secondary | ICD-10-CM | POA: Diagnosis not present

## 2020-08-30 DIAGNOSIS — R197 Diarrhea, unspecified: Secondary | ICD-10-CM

## 2020-08-30 DIAGNOSIS — W540XXA Bitten by dog, initial encounter: Secondary | ICD-10-CM

## 2020-08-30 MED ORDER — VANCOMYCIN HCL 125 MG PO CAPS: 125.0000 mg | ORAL_CAPSULE | Freq: Four times a day (QID) | ORAL | 0 refills | Status: AC

## 2020-08-30 NOTE — Assessment & Plan Note (Signed)
Her recurrent diarrhea has responded to reinstitution of oral vancomycin.  I instructed her to continue vancomycin for 1 full week following the cessation of trimethoprim sulfamethoxazole.

## 2020-08-30 NOTE — Assessment & Plan Note (Signed)
Right hand dog bite wound infection has responded well to antibiotics and recent surgery.  I favor observation off of antibiotics for now.  She will follow-up in 2 weeks.

## 2020-08-30 NOTE — Progress Notes (Signed)
[place] for Infectious Disease  Patient Active Problem List   Diagnosis Date Noted  . Wound infection 08/30/2020    Priority: High  . Fever 08/30/2020    Priority: High  . C. difficile diarrhea 08/22/2020    Priority: High  . Metacarpal bone fracture 08/22/2020    Priority: Medium  . S/P ORIF (open reduction internal fixation) fracture 08/22/2020    Priority: Medium  . Open wound of finger of right hand due to dog bite 07/17/2020    Priority: Medium  . Hypertension 07/13/2020  . Atherosclerosis 07/13/2020  . Anxiety 07/13/2020  . Injury of foot, right 05/17/2020  . Hyperlipidemia 05/04/2020  . Atypical squamous cells of undetermined significance (ASCUS) on Papanicolaou smear of cervix 03/23/2014  . Insomnia 01/22/2011  . Vitamin deficiency syndrome 01/22/2011    Patient's Medications  New Prescriptions   No medications on file  Previous Medications   ASCORBIC ACID (VITAMIN C) 1000 MG TABLET    Take 1,000 mg by mouth daily.   CHOLECALCIFEROL (VITAMIN D) 2000 UNITS CAPS    Take 1 capsule by mouth daily.   COLLAGEN HYDROLYSATE POWD    by Does not apply route.   HYDROCODONE-ACETAMINOPHEN (NORCO/VICODIN) 5-325 MG TABLET    Take 1 tablet by mouth every 8 (eight) hours as needed for moderate pain.   HYDROXYZINE (ATARAX/VISTARIL) 50 MG TABLET    Take 1 tablet (50 mg total) by mouth 3 (three) times daily as needed for anxiety.   IBUPROFEN (ADVIL) 800 MG TABLET    Take 1 tablet (800 mg total) by mouth every 8 (eight) hours as needed.   MAGNESIUM 500 MG CAPS    Take 1 capsule by mouth daily.   MULTIPLE VITAMIN (MULTIVITAMIN WITH MINERALS) TABS TABLET    Take 1 tablet by mouth daily.   ROSUVASTATIN (CRESTOR) 10 MG TABLET    Take 1 tablet (10 mg total) by mouth daily.   SACCHAROMYCES BOULARDII (FLORASTOR) 250 MG CAPSULE    Take by mouth.   VALSARTAN (DIOVAN) 80 MG TABLET    Take 2 tablets (160 mg total) by mouth daily.   ZOLPIDEM (AMBIEN) 5 MG TABLET    Take 5 mg by  mouth at bedtime as needed for sleep.  Modified Medications   Modified Medication Previous Medication   VANCOMYCIN (VANCOCIN) 125 MG CAPSULE vancomycin (VANCOCIN) 125 MG capsule      Take 1 capsule (125 mg total) by mouth 4 (four) times daily for 10 days.    Take 1 capsule (125 mg total) by mouth 4 (four) times daily for 10 days.  Discontinued Medications   SULFAMETHOXAZOLE-TRIMETHOPRIM (BACTRIM DS) 800-160 MG TABLET    Take 1 tablet by mouth 2 (two) times daily.    Subjective: Gabriela Howard is in for her routine follow-up visit.  I asked her to stop taking Trimethoprim/Sulfamethoxazole 2 days ago.  She had been on it for persistent hand infection following her dog bite and recent ORIF surgery to address her fractured first metacarpal.  I asked her to stop Trimethoprim/Sulfamethoxazole because she was having persistent fever, night sweats and chills without evidence of worsening hand infection or diarrhea.  She has felt better with no fever last night.  Her last sutures were removed from her hand yesterday.  She is having normal bowel movements.  Review of Systems: Review of Systems  Constitutional: Positive for diaphoresis and malaise/fatigue. Negative for chills and fever.  Gastrointestinal: Negative for abdominal pain, diarrhea, nausea and vomiting.  Musculoskeletal: Positive for joint pain.  Skin: Negative for rash.    Past Medical History:  Diagnosis Date  . Anxiety   . Colon polyps   . Depression   . High cholesterol   . Insomnia   . Uterine fibroid     Social History   Tobacco Use  . Smoking status: Former Smoker    Packs/day: 1.00    Years: 12.00    Pack years: 12.00    Types: Cigarettes    Quit date: 1994    Years since quitting: 28.2  . Smokeless tobacco: Never Used  Vaping Use  . Vaping Use: Never used  Substance Use Topics  . Alcohol use: Yes    Alcohol/week: 1.0 standard drink    Types: 1 Standard drinks or equivalent per week    Comment: occ  . Drug use: No     Family History  Problem Relation Age of Onset  . Thyroid disease Mother        Blood clots  . Hypertension Mother   . Heart disease Father   . Alzheimer's disease Father   . High Cholesterol Father   . Hyperlipidemia Brother   . Prostate cancer Maternal Uncle     Allergies  Allergen Reactions  . Aleve [Naproxen] Nausea And Vomiting and Nausea Only    Other reaction(s): Dizziness, sick,  Reaction: GI upset   . Aleve [Naproxen Sodium] Other (See Comments)    Reaction:  GI upset   . Sulfamethoxazole-Trimethoprim     Other reaction(s): hives  . Other Nausea Only    MEDICATION TO NUMB GUMS FOR DEEP CLEANING LIGHTHEADNESS, AND JITTERY PER PT.     Objective: Vitals:   08/30/20 0907  BP: 131/88  Pulse: 71  Temp: 98 F (36.7 C)  Weight: 170 lb (77.1 kg)  Height: 5\' 3"  (1.6 m)   Body mass index is 30.11 kg/m.  Physical Exam Constitutional:      Comments: She is in good spirits.  Cardiovascular:     Rate and Rhythm: Normal rate.  Pulmonary:     Effort: Pulmonary effort is normal.  Musculoskeletal:     Comments: Her right hand is looking better.  There is no longer any fluctuance in the webspace between her thumb and index finger.  All incisions appear to be healing nicely there is no unusual erythema and there is no wound drainage.  Skin:    Findings: No rash.           Problem List Items Addressed This Visit      High   C. difficile diarrhea    Her recurrent diarrhea has responded to reinstitution of oral vancomycin.  I instructed her to continue vancomycin for 1 full week following the cessation of trimethoprim sulfamethoxazole.      Relevant Medications   vancomycin (VANCOCIN) 125 MG capsule   Wound infection    Right hand dog bite wound infection has responded well to antibiotics and recent surgery.  I favor observation off of antibiotics for now.  She will follow-up in 2 weeks.      Relevant Medications   vancomycin (VANCOCIN) 125 MG capsule    Fever    Her recent fever may have been due to a Trimethoprim/Sulfamethoxazole hypersensitivity reaction.  It seems to be improving now that she has stopped Trimethoprim/Sulfamethoxazole.       Other Visit Diagnoses    Diarrhea, unspecified type       Relevant Medications   vancomycin (VANCOCIN) 125 MG  capsule   C. difficile colitis       Relevant Medications   vancomycin (VANCOCIN) 125 MG capsule       Michel Bickers, MD [facility] for Infectious [place] [facility] [phone] pager   [phone] cell 08/30/2020, 9:42 AM

## 2020-08-30 NOTE — Assessment & Plan Note (Signed)
Her recent fever may have been due to a Trimethoprim/Sulfamethoxazole hypersensitivity reaction.  It seems to be improving now that she has stopped Trimethoprim/Sulfamethoxazole.

## 2020-09-02 ENCOUNTER — Encounter: Payer: Self-pay | Admitting: Osteopathic Medicine

## 2020-09-04 MED ORDER — ROSUVASTATIN CALCIUM 10 MG PO TABS: 10.0000 mg | ORAL_TABLET | Freq: Every day | ORAL | 3 refills | Status: DC

## 2020-09-13 ENCOUNTER — Ambulatory Visit: Payer: No Typology Code available for payment source | Admitting: Internal Medicine

## 2020-09-19 ENCOUNTER — Ambulatory Visit: Payer: No Typology Code available for payment source | Admitting: Internal Medicine

## 2020-10-15 ENCOUNTER — Other Ambulatory Visit: Payer: Self-pay | Admitting: Medical-Surgical

## 2020-10-15 NOTE — Telephone Encounter (Signed)
Routing to PCP

## 2020-12-19 ENCOUNTER — Encounter: Payer: Self-pay | Admitting: Osteopathic Medicine

## 2020-12-24 ENCOUNTER — Encounter: Payer: Self-pay | Admitting: Osteopathic Medicine

## 2020-12-24 ENCOUNTER — Other Ambulatory Visit: Payer: Self-pay

## 2020-12-24 ENCOUNTER — Ambulatory Visit (INDEPENDENT_AMBULATORY_CARE_PROVIDER_SITE_OTHER): Payer: No Typology Code available for payment source | Admitting: Osteopathic Medicine

## 2020-12-24 VITALS — BP 115/80 | HR 71 | Ht 63.0 in | Wt 160.0 lb

## 2020-12-24 DIAGNOSIS — L259 Unspecified contact dermatitis, unspecified cause: Secondary | ICD-10-CM | POA: Diagnosis not present

## 2020-12-24 MED ORDER — PERMETHRIN 5 % EX CREA: 1.0000 "application " | TOPICAL_CREAM | Freq: Once | CUTANEOUS | 1 refills | Status: AC

## 2020-12-24 MED ORDER — HYDROXYZINE PAMOATE 25 MG PO CAPS: 25.0000 mg | ORAL_CAPSULE | Freq: Three times a day (TID) | ORAL | 0 refills | Status: DC | PRN

## 2020-12-24 NOTE — Progress Notes (Signed)
Gabriela Howard is a 60 y.o. female who presents to  [place] at [facility]  today, 12/24/20, seeking care for the following:  Recently went camping, couple of days after she returned from her trip she had some red, scattered, itchy spots on legs and arms.  Has checked her pets, no evidence of fleas or other pests infecting her dogs.  No one else has similar rash.  She had a similar outbreak of rash after camping in the past, a dermatologist had prescribed permethrin which seem to help.  She requests refill on this medication.  On exam, small scattered red slightly excoriated lesions on arms and legs consistent with allergic reaction to some sort of insect bite.  Nothing to truly give evidence for scabies     ASSESSMENT & PLAN with other pertinent findings:  The encounter diagnosis was Contact dermatitis, unspecified contact dermatitis type, unspecified trigger -likely irritation/allergy reaction to insect saliva/venom.   Okay to refill permethrin, could not say for sure that this is truly a skin infestation but would defer to dermatology if lesions or not improving with this as well as with antihistamine treatment.  Patient advised to keep skin clean, avoid other irritants, let me know if anything changes or gets worse or if its not resolving in the next week or 2  There are no Patient Instructions on file for this visit.  No orders of the defined types were placed in this encounter.   Meds ordered this encounter  Medications   permethrin (ELIMITE) 5 % cream    Sig: Apply 1 application topically once for 1 dose. Apply to entire body from neck to feet and leave on overnight, avoid face.    Dispense:  60 g    Refill:  1   hydrOXYzine (VISTARIL) 25 MG capsule    Sig: Take 1-2 capsules (25-50 mg total) by mouth every 8 (eight) hours as needed for itching.    Dispense:  30 capsule    Refill:  0     See below for relevant physical exam  findings  See below for recent lab and imaging results reviewed  Medications, allergies, PMH, PSH, SocH, FamH reviewed below    Follow-up instructions: Return if symptoms worsen or fail to improve.                                        Exam:  BP 115/80   Pulse 71   Ht '5\' 3"'$  (1.6 m)   Wt 160 lb (72.6 kg)   LMP 04/05/2016   BMI 28.34 kg/m  Constitutional: VS see above. General Appearance: alert, well-developed, well-nourished, NAD Neck: No masses, trachea midline.  Respiratory: Normal respiratory effort. no wheeze, no rhonchi, no rales Cardiovascular: S1/S2 normal, no murmur, no rub/gallop auscultated. RRR.  Musculoskeletal: Gait normal. Symmetric and independent movement of all extremities Neurological: Normal balance/coordination. No tremor. Skin: warm, dry, intact.  See above Psychiatric: Normal judgment/insight. Normal mood and affect. Oriented x3.   Current Meds  Medication Sig   hydrOXYzine (VISTARIL) 25 MG capsule Take 1-2 capsules (25-50 mg total) by mouth every 8 (eight) hours as needed for itching.   permethrin (ELIMITE) 5 % cream Apply 1 application topically once for 1 dose. Apply to entire body from neck to feet and leave on overnight, avoid face.    Allergies  Allergen Reactions   Aleve [  Naproxen] Nausea And Vomiting and Nausea Only    Other reaction(s): Dizziness, sick,  Reaction:  GI upset    Aleve [Naproxen Sodium] Other (See Comments)    Reaction:  GI upset    Sulfamethoxazole-Trimethoprim     Other reaction(s): hives   Other Nausea Only    MEDICATION TO NUMB GUMS FOR DEEP CLEANING LIGHTHEADNESS, AND JITTERY PER PT.      Patient Active Problem List   Diagnosis Date Noted   Wound infection 08/30/2020   Fever 08/30/2020   Metacarpal bone fracture 08/22/2020   S/P ORIF (open reduction internal fixation) fracture 08/22/2020   C. difficile diarrhea 08/22/2020   Open wound of finger of right hand due to dog bite  07/17/2020   Hypertension 07/13/2020   Atherosclerosis 07/13/2020   Anxiety 07/13/2020   Injury of foot, right 05/17/2020   Hyperlipidemia 05/04/2020   Atypical squamous cells of undetermined significance (ASCUS) on Papanicolaou smear of cervix 03/23/2014   Insomnia 01/22/2011   Vitamin deficiency syndrome 01/22/2011    Family History  Problem Relation Age of Onset   Thyroid disease Mother        Blood clots   Hypertension Mother    Heart disease Father    Alzheimer's disease Father    High Cholesterol Father    Hyperlipidemia Brother    Prostate cancer Maternal Uncle     Social History   Tobacco Use  Smoking Status Former   Packs/day: 1.00   Years: 12.00   Pack years: 12.00   Types: Cigarettes   Quit date: 1994   Years since quitting: 28.6  Smokeless Tobacco Never    Past Surgical History:  Procedure Laterality Date   ABDOMINAL HYSTERECTOMY  05/03/2016   LAVH, total   WRIST SURGERY      Immunization History  Administered Date(s) Administered   Influenza-Unspecified 04/01/2012, 03/24/2016   PFIZER(Purple Top)SARS-COV-2 Vaccination 09/17/2019, 10/08/2019, 12/25/2019   Tdap 09/18/2011, 07/12/2020   Zoster Recombinat (Shingrix) 11/11/2019    No results found for this or any previous visit (from the past 2160 hour(s)).  No results found.     All questions at time of visit were answered - patient instructed to contact office with any additional concerns or updates. ER/RTC precautions were reviewed with the patient as applicable.   Please note: manual typing as well as voice recognition software may have been used to produce this document - typos may escape review. Please contact Dr. Sheppard Coil for any needed clarifications.

## 2020-12-25 ENCOUNTER — Ambulatory Visit: Payer: No Typology Code available for payment source | Admitting: Osteopathic Medicine

## 2020-12-27 ENCOUNTER — Ambulatory Visit: Payer: No Typology Code available for payment source | Admitting: Osteopathic Medicine

## 2021-01-03 ENCOUNTER — Other Ambulatory Visit: Payer: Self-pay | Admitting: Osteopathic Medicine

## 2021-05-14 ENCOUNTER — Ambulatory Visit (INDEPENDENT_AMBULATORY_CARE_PROVIDER_SITE_OTHER): Payer: No Typology Code available for payment source | Admitting: Cardiovascular Disease

## 2021-05-14 ENCOUNTER — Other Ambulatory Visit: Payer: Self-pay

## 2021-05-14 ENCOUNTER — Encounter: Payer: Self-pay | Admitting: Cardiovascular Disease

## 2021-05-14 ENCOUNTER — Ambulatory Visit (INDEPENDENT_AMBULATORY_CARE_PROVIDER_SITE_OTHER): Payer: No Typology Code available for payment source

## 2021-05-14 VITALS — BP 130/74 | HR 76 | Ht 62.0 in | Wt 179.6 lb

## 2021-05-14 DIAGNOSIS — R002 Palpitations: Secondary | ICD-10-CM | POA: Diagnosis not present

## 2021-05-14 DIAGNOSIS — E785 Hyperlipidemia, unspecified: Secondary | ICD-10-CM | POA: Diagnosis not present

## 2021-05-14 NOTE — Progress Notes (Unsigned)
Enrolled for [employer] to mail a ZIO XT long term holter monitor to the patients address on file.  

## 2021-05-14 NOTE — Progress Notes (Signed)
Cardiology Office Note:    Date:  05/20/2021   ID:  Gabriela Howard, DOB [birth year], MRN [id number]  PCP:  Emeterio Reeve, DO   [facility] Providers Cardiologist:  None     Referring MD: Emeterio Reeve, DO   No chief complaint on file. Gabriela Howard is a 60 y.o. female who is being seen today for the evaluation of palpitations and hypercholesterolemia at the request of Emeterio Reeve, DO.   History of Present Illness:    Gabriela Howard is a 60 y.o. female with a hx of Hypercholesterolemia, anxiety and osteoarthritis involving mostly her upper extremities.  The palpitations seems to have rather abrupt onset but have gradual termination.  They are not associated with physical activity or any obvious trigger.  They are more prominent in the evenings.  She is on rosuvastatin for hypercholesterolemia. 07/12/2019 labs (off meds) showed LDL 164, HDL 48, TG 91.  She denies chest pain or shortness of breath either at rest or with activity, syncope, lower extremity edema, orthopnea, PND, intermittent claudication or focal neurological complaints.  Past Medical History:  Diagnosis Date   Anxiety    Colon polyps    Depression    High cholesterol    Insomnia    Uterine fibroid     Past Surgical History:  Procedure Laterality Date   ABDOMINAL HYSTERECTOMY  05/03/2016   LAVH, total   WRIST SURGERY      Current Medications: Current Meds  Medication Sig   Ascorbic Acid (VITAMIN C) 1000 MG tablet Take 1,000 mg by mouth daily.   Cholecalciferol (VITAMIN D) 2000 units CAPS Take 1 capsule by mouth daily.   Collagen Hydrolysate POWD by Does not apply route.   Magnesium 500 MG CAPS Take 1 capsule by mouth daily.   valsartan (DIOVAN) 80 MG tablet TAKE 1 TABLET BY MOUTH EVERY DAY     Allergies:   Aleve [naproxen], Aleve [naproxen sodium], Sulfamethoxazole-trimethoprim, and Other   Social History   Socioeconomic History   Marital status: Married    Spouse  name: Not on file   Number of children: 3   Years of education: Not on file   Highest education level: Not on file  Occupational History   Occupation: IT    Employer: [employer]  Tobacco Use   Smoking status: Former    Packs/day: 1.00    Years: 12.00    Pack years: 12.00    Types: Cigarettes    Quit date: 1994    Years since quitting: 29.0   Smokeless tobacco: Never  Vaping Use   Vaping Use: Never used  Substance and Sexual Activity   Alcohol use: Yes    Alcohol/week: 1.0 standard drink    Types: 1 Standard drinks or equivalent per week    Comment: occ   Drug use: No   Sexual activity: Not Currently    Birth control/protection: Surgical, Abstinence    Comment: LAVH  Other Topics Concern   Not on file  Social History Narrative   Not on file   Social Determinants of Health   Financial Resource Strain: Not on file  Food Insecurity: Not on file  Transportation Needs: Not on file  Physical Activity: Not on file  Stress: Not on file  Social Connections: Not on file     Family History: The patient's family history includes Alzheimer's disease in her father; Heart disease in her father; High Cholesterol in her father; Hyperlipidemia in her brother; Hypertension in her mother;  Prostate cancer in her maternal uncle; Thyroid disease in her mother.  ROS:   Please see the history of present illness.     All other systems reviewed and are negative.  EKGs/Labs/Other Studies Reviewed:    The following studies were reviewed today: ECG treadmill test There was no ST segment deviation noted during stress. Good exercise time 9 min. Overall low risk test with no electrocardiographic evidence of ischemia.  EKG:  EKG is  ordered today.  The ekg ordered today demonstrates NSR, normal ECG  Recent Labs: No results found for requested labs within last 8760 hours.  Recent Lipid Panel No results found for: CHOL, TRIG, HDL, CHOLHDL, VLDL, LDLCALC, LDLDIRECT  07/12/2019  LDL 164, HDL 48, TG 91  Risk Assessment/Calculations:           Physical Exam:    VS:  BP 130/74    Pulse 76    Ht 5\' 2"  (1.575 m)    Wt 179 lb 9.6 oz (81.5 kg)    LMP 04/05/2016    SpO2 94%    BMI 32.85 kg/m     Wt Readings from Last 3 Encounters:  05/14/21 179 lb 9.6 oz (81.5 kg)  12/24/20 160 lb (72.6 kg)  08/30/20 170 lb (77.1 kg)     GEN:  Well nourished, well developed in no acute distress HEENT: Normal NECK: No JVD; No carotid bruits LYMPHATICS: No lymphadenopathy CARDIAC: RRR, no murmurs, rubs, gallops RESPIRATORY:  Clear to auscultation without rales, wheezing or rhonchi  ABDOMEN: Soft, non-tender, non-distended MUSCULOSKELETAL:  No edema; No deformity  SKIN: Warm and dry NEUROLOGIC:  Alert and oriented x 3 PSYCHIATRIC:  Normal affect   ASSESSMENT:    1. Palpitations   2. Hyperlipidemia, unspecified hyperlipidemia type    PLAN:    In order of problems listed above:  Palpitations: Hard to say whether this represents sinus tachycardia or true arrhythmia based on her description.  We will have her wear an arrhythmia monitor. Hypercholesterolemia: (Untreated?) LDL level from the 2012-2015.  Appears to be in the 130-160 range.  Updated labs.           Medication Adjustments/Labs and Tests Ordered: Current medicines are reviewed at length with the patient today.  Concerns regarding medicines are outlined above.  Orders Placed This Encounter  Procedures   Lipid panel   LONG TERM MONITOR (3-14 DAYS)   EKG 12-Lead   No orders of the defined types were placed in this encounter.   Patient Instructions  Medication Instructions:  No changes *If you need a refill on your cardiac medications before your next appointment, please call your pharmacy*   Lab Work: Your provider would like for you to return in the next few weeks to have the following labs drawn: fasting lipid. You do not need an appointment for the lab. Once in our office lobby there is a podium  where you can sign in and ring the doorbell to alert [place] that you are here. The lab is open from 8:00 am to 4:30 pm; closed for lunch from 12:45pm-1:45pm.  If you have labs (blood work) drawn today and your tests are completely normal, you will receive your results only by: [place] (if you have MyChart) OR A paper copy in the mail If you have any lab test that is abnormal or we need to change your treatment, we will call you to review the results.   Testing/Procedures: Bryn Gulling- Long Term Monitor Instructions  Your physician has requested  you wear a ZIO patch monitor for 14 days.  This is a single patch monitor. [employer] supplies one patch monitor per enrollment. Additional stickers are not available. Please do not apply patch if you will be having a Nuclear Stress Test,  Echocardiogram, Cardiac CT, MRI, or Chest Xray during the period you would be wearing the  monitor. The patch cannot be worn during these tests. You cannot remove and re-apply the  ZIO XT patch monitor.  Your ZIO patch monitor will be mailed 3 day USPS to your address on file. It may take 3-5 days  to receive your monitor after you have been enrolled.  Once you have received your monitor, please review the enclosed instructions. Your monitor  has already been registered assigning a specific monitor serial # to you.  Billing and Patient Assistance Program Information  We have supplied [employer] with any of your insurance information on file for billing purposes. [employer] offers a sliding scale Patient Assistance Program for patients that do not have  insurance, or whose insurance does not completely cover the cost of the ZIO monitor.  You must apply for the Patient Assistance Program to qualify for this discounted rate.  To apply, please call [employer] at [phone], select option 4, select option 2, ask to apply for  Patient Assistance Program. Theodore Demark will ask your household income, and how many people  are in your  household. They will quote your out-of-pocket cost based on that information.  [employer] will also be able to set up a 76-month, interest-free payment plan if needed.  Applying the monitor   Shave hair from upper left chest.  Hold abrader disc by orange tab. Rub abrader in 40 strokes over the upper left chest as  indicated in your monitor instructions.  Clean area with 4 enclosed alcohol pads. Let dry.  Apply patch as indicated in monitor instructions. Patch will be placed under collarbone on left  side of chest with arrow pointing upward.  Rub patch adhesive wings for 2 minutes. Remove white label marked "1". Remove the white  label marked "2". Rub patch adhesive wings for 2 additional minutes.  While looking in a mirror, press and release button in center of patch. A small green light will  flash 3-4 times. This will be your only indicator that the monitor has been turned on.  Do not shower for the first 24 hours. You may shower after the first 24 hours.  Press the button if you feel a symptom. You will hear a small click. Record Date, Time and  Symptom in the Patient Logbook.  When you are ready to remove the patch, follow instructions on the last 2 pages of Patient  Logbook. Stick patch monitor onto the last page of Patient Logbook.  Place Patient Logbook in the blue and white box. Use locking tab on box and tape box closed  securely. The blue and white box has prepaid postage on it. Please place it in the mailbox as  soon as possible. Your physician should have your test results approximately 7 days after the  monitor has been mailed back to [facility].  Call [place] at [phone] if you have questions regarding  your ZIO XT patch monitor. Call them immediately if you see an orange light blinking on your  monitor.  If your monitor falls off in less than 4 days, contact our Monitor department at [phone].  If your monitor becomes loose or falls off after  4 days call  [employer] at [phone] for  suggestions on securing your monitor    Follow-Up: At [facility], you and your health needs are our priority.  As part of our continuing mission to provide you with exceptional heart care, we have created designated Provider Care Teams.  These Care Teams include your primary Cardiologist (physician) and Advanced Practice Providers (APPs -  Physician Assistants and Nurse Practitioners) who all work together to provide you with the care you need, when you need it.  We recommend signing up for the patient portal called "MyChart".  Sign up information is provided on this After Visit Summary.  MyChart is used to connect with patients for Virtual Visits (Telemedicine).  Patients are able to view lab/test results, encounter notes, upcoming appointments, etc.  Non-urgent messages can be sent to your provider as well.   To learn more about what you can do with MyChart, go to [email or web address].    Your next appointment:   Follow up as needed with Dr. Sallyanne Kuster    Signed, Sanda Klein, MD  05/20/2021 6:28 PM    [place]

## 2021-05-14 NOTE — Patient Instructions (Signed)
Medication Instructions:  No changes *If you need a refill on your cardiac medications before your next appointment, please call your pharmacy*   Lab Work: Your provider would like for you to return in the next few weeks to have the following labs drawn: fasting lipid. You do not need an appointment for the lab. Once in our office lobby there is a podium where you can sign in and ring the doorbell to alert [place] that you are here. The lab is open from 8:00 am to 4:30 pm; closed for lunch from 12:45pm-1:45pm.  If you have labs (blood work) drawn today and your tests are completely normal, you will receive your results only by: [place] (if you have MyChart) OR A paper copy in the mail If you have any lab test that is abnormal or we need to change your treatment, we will call you to review the results.   Testing/Procedures: Bryn Gulling- Long Term Monitor Instructions  Your physician has requested you wear a ZIO patch monitor for 14 days.  This is a single patch monitor. [employer] supplies one patch monitor per enrollment. Additional stickers are not available. Please do not apply patch if you will be having a Nuclear Stress Test,  Echocardiogram, Cardiac CT, MRI, or Chest Xray during the period you would be wearing the  monitor. The patch cannot be worn during these tests. You cannot remove and re-apply the  ZIO XT patch monitor.  Your ZIO patch monitor will be mailed 3 day USPS to your address on file. It may take 3-5 days  to receive your monitor after you have been enrolled.  Once you have received your monitor, please review the enclosed instructions. Your monitor  has already been registered assigning a specific monitor serial # to you.  Billing and Patient Assistance Program Information  We have supplied [employer] with any of your insurance information on file for billing purposes. [employer] offers a sliding scale Patient Assistance Program for patients that do not have  insurance, or  whose insurance does not completely cover the cost of the ZIO monitor.  You must apply for the Patient Assistance Program to qualify for this discounted rate.  To apply, please call [employer] at [phone], select option 4, select option 2, ask to apply for  Patient Assistance Program. Theodore Demark will ask your household income, and how many people  are in your household. They will quote your out-of-pocket cost based on that information.  [employer] will also be able to set up a 2-month, interest-free payment plan if needed.  Applying the monitor   Shave hair from upper left chest.  Hold abrader disc by orange tab. Rub abrader in 40 strokes over the upper left chest as  indicated in your monitor instructions.  Clean area with 4 enclosed alcohol pads. Let dry.  Apply patch as indicated in monitor instructions. Patch will be placed under collarbone on left  side of chest with arrow pointing upward.  Rub patch adhesive wings for 2 minutes. Remove white label marked "1". Remove the white  label marked "2". Rub patch adhesive wings for 2 additional minutes.  While looking in a mirror, press and release button in center of patch. A small green light will  flash 3-4 times. This will be your only indicator that the monitor has been turned on.  Do not shower for the first 24 hours. You may shower after the first 24 hours.  Press the button if you feel a symptom. You will hear  a small click. Record Date, Time and  Symptom in the Patient Logbook.  When you are ready to remove the patch, follow instructions on the last 2 pages of Patient  Logbook. Stick patch monitor onto the last page of Patient Logbook.  Place Patient Logbook in the blue and white box. Use locking tab on box and tape box closed  securely. The blue and white box has prepaid postage on it. Please place it in the mailbox as  soon as possible. Your physician should have your test results approximately 7 days after the  monitor has been mailed  back to [facility].  Call [place] at [phone] if you have questions regarding  your ZIO XT patch monitor. Call them immediately if you see an orange light blinking on your  monitor.  If your monitor falls off in less than 4 days, contact our Monitor department at [phone].  If your monitor becomes loose or falls off after 4 days call [employer] at [phone] for  suggestions on securing your monitor    Follow-Up: At [facility], you and your health needs are our priority.  As part of our continuing mission to provide you with exceptional heart care, we have created designated Provider Care Teams.  These Care Teams include your primary Cardiologist (physician) and Advanced Practice Providers (APPs -  Physician Assistants and Nurse Practitioners) who all work together to provide you with the care you need, when you need it.  We recommend signing up for the patient portal called "MyChart".  Sign up information is provided on this After Visit Summary.  MyChart is used to connect with patients for Virtual Visits (Telemedicine).  Patients are able to view lab/test results, encounter notes, upcoming appointments, etc.  Non-urgent messages can be sent to your provider as well.   To learn more about what you can do with MyChart, go to [email or web address].    Your next appointment:   Follow up as needed with Dr. Sallyanne Kuster

## 2021-05-24 LAB — LIPID PANEL
Chol/HDL Ratio: 3.6 ratio (ref 0.0–4.4)
Cholesterol, Total: 155 mg/dL (ref 100–199)
HDL: 43 mg/dL (ref >39)
LDL Chol Calc (NIH): 95 mg/dL (ref 0–99)
Triglycerides: 91 mg/dL (ref 0–149)
VLDL Cholesterol Cal: 17 mg/dL (ref 5–40)

## 2021-05-27 ENCOUNTER — Encounter: Payer: Self-pay | Admitting: Cardiovascular Disease

## 2021-05-28 DIAGNOSIS — R002 Palpitations: Secondary | ICD-10-CM | POA: Diagnosis not present

## 2021-05-30 ENCOUNTER — Encounter: Payer: Self-pay | Admitting: *Deleted

## 2021-05-30 NOTE — Progress Notes (Signed)
Patient ID: Truman Hayward, female   DOB: [birth year], 62 y.o.   MRN: [id number] Hello,  This email is to notify you that the patient listed below had an early fall off issue where the patch fell off in the first 24 hours of wear. We have placed an order for a replacement device to be overnighted to the patient. We have placed a hold on the billing, so the patient is not charged for the first device. If you have any questions, please respond to this email, or call us at [phone].  Patient initials: M.B  Patient ID: [id number]  SN: [id number]  Ticket: [id number]   Account: [facility]   Thank you, [facility]

## 2021-06-24 ENCOUNTER — Other Ambulatory Visit: Payer: Self-pay | Admitting: *Deleted

## 2021-06-24 DIAGNOSIS — R002 Palpitations: Secondary | ICD-10-CM

## 2021-06-27 LAB — TSH: TSH: 1.05 (ref 0.41–5.90)

## 2021-06-27 LAB — HM DEXA SCAN

## 2021-06-27 LAB — CBC AND DIFFERENTIAL: Hemoglobin: 15.5 (ref 12.0–16.0)

## 2021-06-27 LAB — HEMOGLOBIN A1C: Hemoglobin A1C: 6

## 2021-06-29 ENCOUNTER — Encounter: Payer: Self-pay | Admitting: Cardiovascular Disease

## 2021-07-01 ENCOUNTER — Ambulatory Visit (HOSPITAL_COMMUNITY)
Admission: RE | Admit: 2021-07-01 | Discharge: 2021-07-01 | Disposition: A | Discharge status: Home / Self Care | Payer: No Typology Code available for payment source | Source: Ambulatory Visit | Attending: Cardiovascular Disease | Admitting: Cardiovascular Disease

## 2021-07-01 ENCOUNTER — Encounter: Payer: Self-pay | Admitting: Cardiovascular Disease

## 2021-07-01 DIAGNOSIS — R002 Palpitations: Secondary | ICD-10-CM

## 2021-07-01 LAB — ECHOCARDIOGRAM COMPLETE
Area-P 1/2: 4.21 cm2
S' Lateral: 2.7 cm

## 2021-07-07 ENCOUNTER — Other Ambulatory Visit: Payer: Self-pay | Admitting: Osteopathic Medicine

## 2021-07-08 ENCOUNTER — Telehealth: Payer: No Typology Code available for payment source | Admitting: Nurse Practitioner

## 2021-07-08 DIAGNOSIS — R051 Acute cough: Secondary | ICD-10-CM

## 2021-07-08 MED ORDER — ALBUTEROL SULFATE HFA 108 (90 BASE) MCG/ACT IN AERS
2.0000 | INHALATION_SPRAY | Freq: Four times a day (QID) | RESPIRATORY_TRACT | 0 refills | Status: DC | PRN
Start: 2021-07-08 — End: 2021-10-16

## 2021-07-08 MED ORDER — PREDNISONE 20 MG PO TABS
20.0000 mg | ORAL_TABLET | Freq: Two times a day (BID) | ORAL | 0 refills | Status: AC
Start: 2021-07-08 — End: 2021-07-13

## 2021-07-08 NOTE — Progress Notes (Signed)
We are sorry that you are not feeling well.  Here is how we plan to help!  Based on your presentation I believe you most likely have A cough due to a virus.  This is called viral bronchitis and is best treated by rest, plenty of fluids and control of the cough.  You may use Ibuprofen or Tylenol as directed to help your symptoms.     In addition you may use A non-prescription cough medication called Mucinex DM: take 2 tablets every 12 hours. We will also prescribe a 5 days course or prednisone to help with the cough, and an inhaler to help with breathing.   Meds ordered this encounter  Medications   predniSONE (DELTASONE) 20 MG tablet    Sig: Take 1 tablet (20 mg total) by mouth 2 (two) times daily with a meal for 5 days.    Dispense:  10 tablet    Refill:  0   albuterol (VENTOLIN HFA) 108 (90 Base) MCG/ACT inhaler    Sig: Inhale 2 puffs into the lungs every 6 (six) hours as needed for wheezing or shortness of breath.    Dispense:  8 g    Refill:  0     Our goal is to avoid antibiotics if not needed. Since the cough and congestion have only been present for 4 days we will start with inflammatory relief. If the cough and fever persist you should be evaluated in person to assess the need for an antibiotic.   From your responses in the eVisit questionnaire you describe inflammation in the upper respiratory tract which is causing a significant cough.  This is commonly called Bronchitis and has four common causes:   Allergies Viral Infections Acid Reflux Bacterial Infection Allergies, viruses and acid reflux are treated by controlling symptoms or eliminating the cause. An example might be a cough caused by taking certain blood pressure medications. You stop the cough by changing the medication. Another example might be a cough caused by acid reflux. Controlling the reflux helps control the cough.  USE OF BRONCHODILATOR ("RESCUE") INHALERS: There is a risk from using your bronchodilator too  frequently.  The risk is that over-reliance on a medication which only relaxes the muscles surrounding the breathing tubes can reduce the effectiveness of medications prescribed to reduce swelling and congestion of the tubes themselves.  Although you feel brief relief from the bronchodilator inhaler, your asthma may actually be worsening with the tubes becoming more swollen and filled with mucus.  This can delay other crucial treatments, such as oral steroid medications. If you need to use a bronchodilator inhaler daily, several times per day, you should discuss this with your provider.  There are probably better treatments that could be used to keep your asthma under control.     HOME CARE Only take medications as instructed by your medical team. Complete the entire course of an antibiotic. Drink plenty of fluids and get plenty of rest. Avoid close contacts especially the very young and the elderly Cover your mouth if you cough or cough into your sleeve. Always remember to wash your hands A steam or ultrasonic humidifier can help congestion.   GET HELP RIGHT AWAY IF: You develop worsening fever. You become short of breath You cough up blood. Your symptoms persist after you have completed your treatment plan MAKE SURE YOU  Understand these instructions. Will watch your condition. Will get help right away if you are not doing well or get worse.  Thank you for choosing an e-visit.  Your e-visit answers were reviewed by a board certified advanced clinical practitioner to complete your personal care plan. Depending upon the condition, your plan could have included both over the counter or prescription medications.  Please review your pharmacy choice. Make sure the pharmacy is open so you can pick up prescription now. If there is a problem, you may contact your provider through [employer] and have the prescription routed to another pharmacy.  Your safety is important to [place]. If you have  drug allergies check your prescription carefully.   For the next 24 hours you can use MyChart to ask questions about today's visit, request a non-urgent call back, or ask for a work or school excuse. You will get an email in the next two days asking about your experience. I hope that your e-visit has been valuable and will speed your recovery.   Meds ordered this encounter  Medications   predniSONE (DELTASONE) 20 MG tablet    Sig: Take 1 tablet (20 mg total) by mouth 2 (two) times daily with a meal for 5 days.    Dispense:  10 tablet    Refill:  0   albuterol (VENTOLIN HFA) 108 (90 Base) MCG/ACT inhaler    Sig: Inhale 2 puffs into the lungs every 6 (six) hours as needed for wheezing or shortness of breath.    Dispense:  8 g    Refill:  0     I spent approximately 7 minutes reviewing the patient's history, current symptoms and coordinating their plan of care today.

## 2021-07-22 ENCOUNTER — Other Ambulatory Visit: Payer: Self-pay | Admitting: Family Medicine

## 2021-07-29 ENCOUNTER — Encounter: Payer: Self-pay | Admitting: Cardiovascular Disease

## 2021-07-29 MED ORDER — VALSARTAN 80 MG PO TABS: 80.0000 mg | ORAL_TABLET | Freq: Every day | ORAL | 1 refills | Status: DC

## 2021-08-19 IMAGING — DX DG FOOT COMPLETE 3+V*R*
3 series · 3 of 3 positions shown · non-contrast
Comparison: None.

CLINICAL DATA: Pain third MTP after trauma.

EXAM:
RIGHT FOOT COMPLETE - 3+ VIEW

[foot ap]
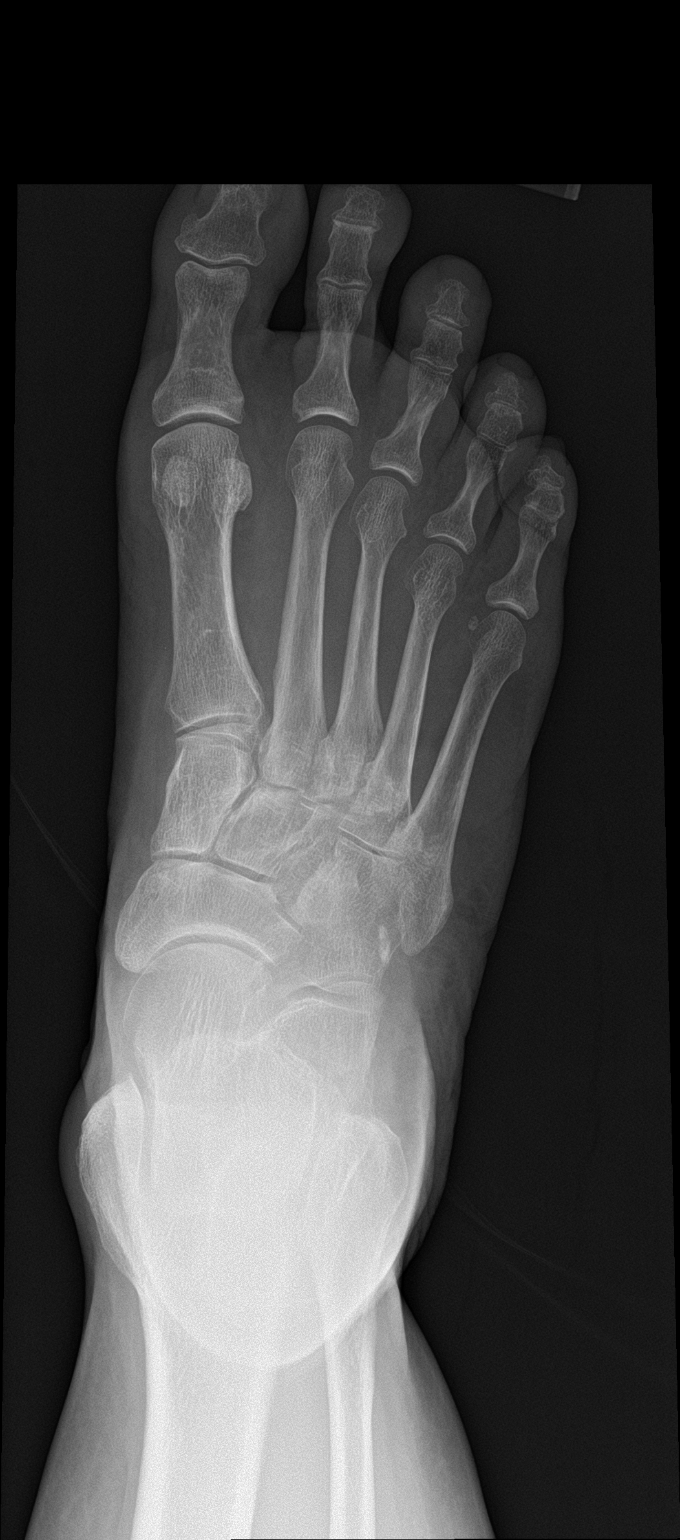

[foot obl]
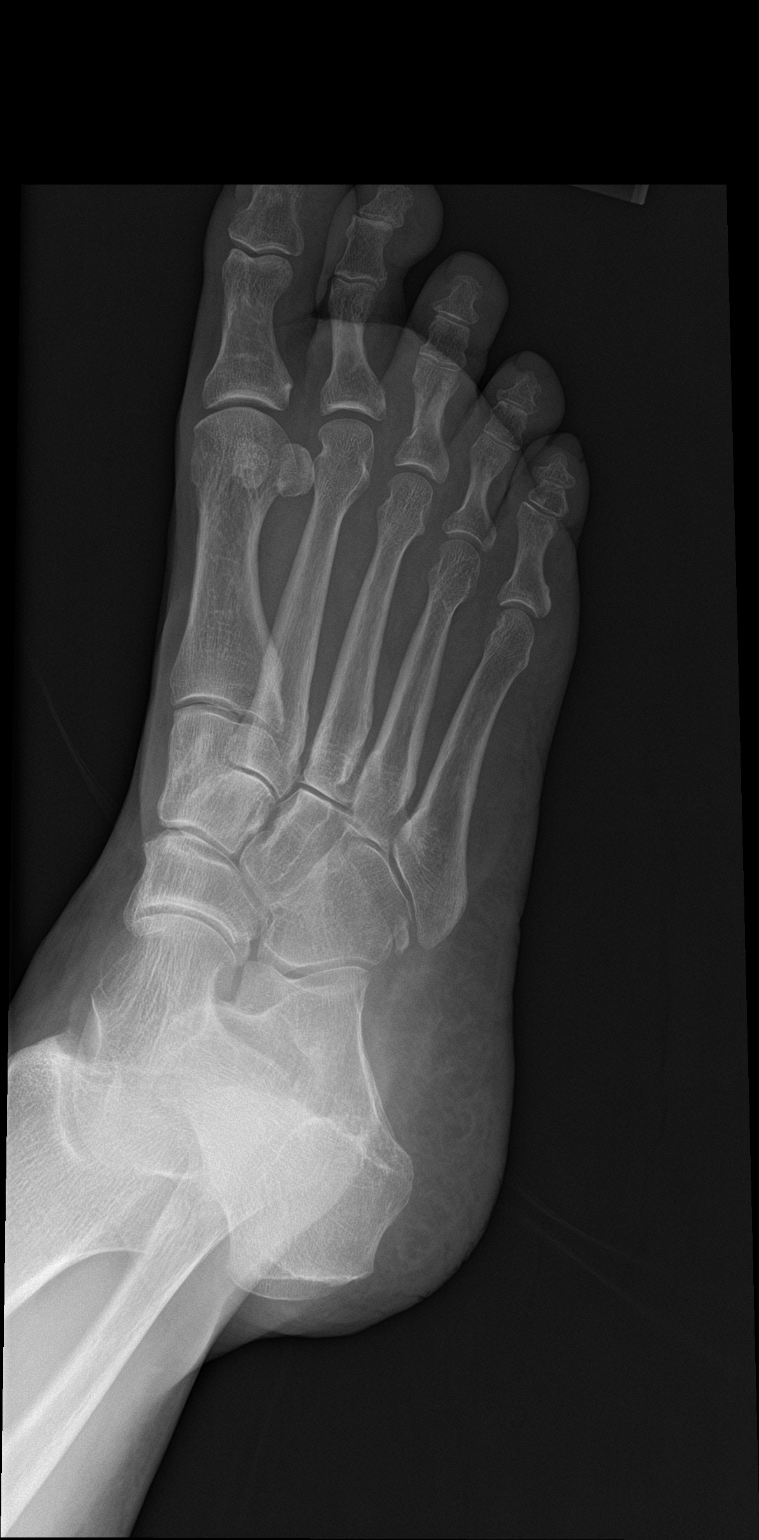

[foot lat]
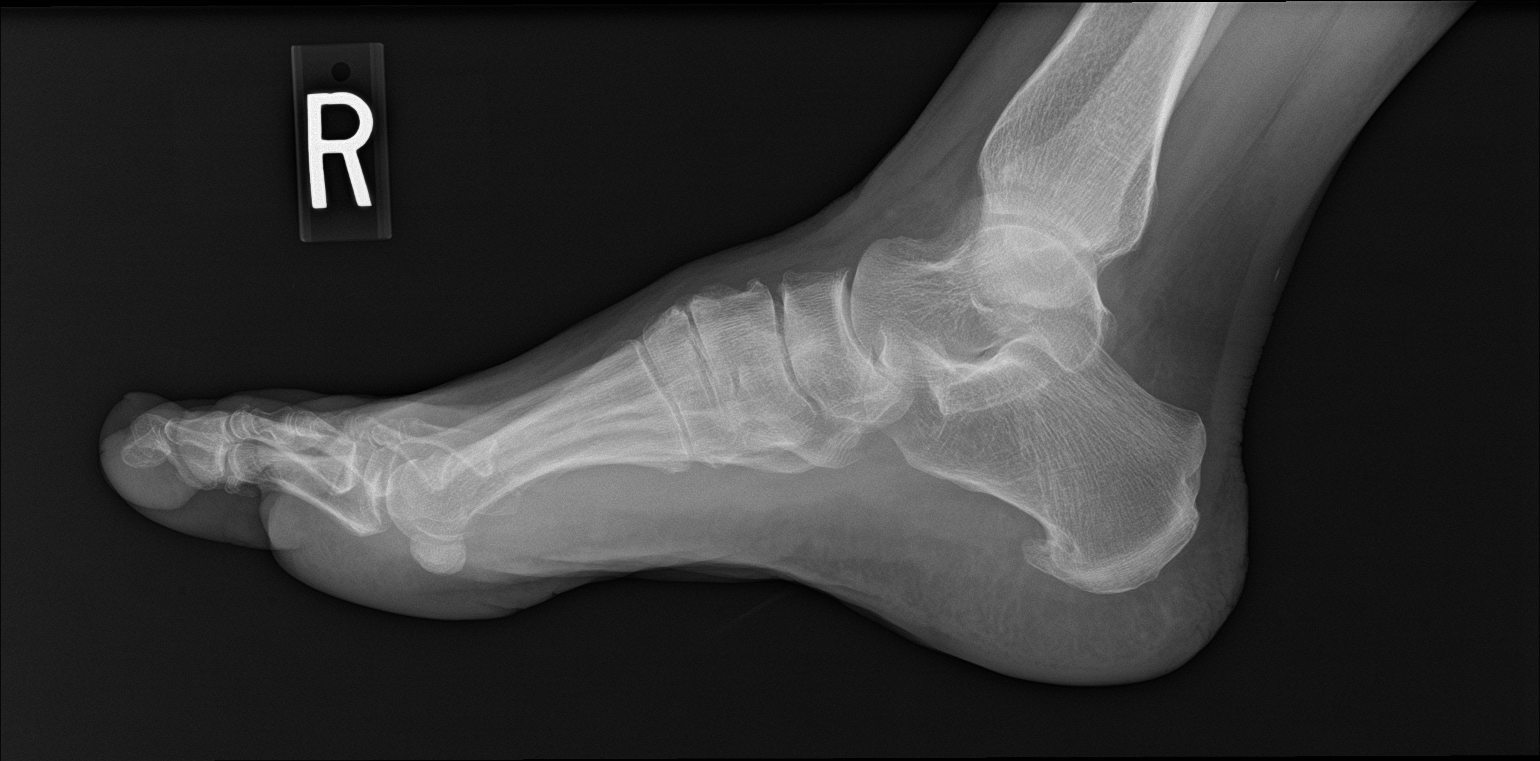

[3 of 3 positions shown; findings below may reference images not displayed]

FINDINGS: There is a minimally displaced oblique fracture of the midshaft
third proximal phalanx compatible with acute to subacute fracture.
Mild degenerative change of the midfoot. Small inferior calcaneal
spur.
IMPRESSION: Minimally displaced acute to subacute fracture of the third proximal
phalanx.

## 2021-08-21 ENCOUNTER — Other Ambulatory Visit: Payer: Self-pay | Admitting: Family Medicine

## 2021-08-21 NOTE — Telephone Encounter (Signed)
Looks like she has appt with another PCP (Dr. Raoul Pitch, 11/27/2021). Is she planning on changing PCP office?

## 2021-08-22 MED ORDER — VALSARTAN 80 MG PO TABS: 80.0000 mg | ORAL_TABLET | Freq: Every day | ORAL | 1 refills | Status: DC

## 2021-09-16 ENCOUNTER — Telehealth: Payer: Self-pay | Admitting: Family Medicine

## 2021-09-16 ENCOUNTER — Other Ambulatory Visit: Payer: Self-pay | Admitting: Cardiovascular Disease

## 2021-09-16 NOTE — Telephone Encounter (Signed)
Patient is requesting a refill of her valsartan. She had a TOC scheduled for 4/25 but it had to be rescheduled for 5/24 because the doctor is out of the office. ?

## 2021-09-17 ENCOUNTER — Ambulatory Visit: Payer: No Typology Code available for payment source | Admitting: Family Medicine

## 2021-09-17 MED ORDER — VALSARTAN 80 MG PO TABS: 80.0000 mg | ORAL_TABLET | Freq: Every day | ORAL | 1 refills | Status: DC

## 2021-09-17 NOTE — Telephone Encounter (Signed)
Task completed. Patient has been updated that rx refill sent to preferred pharmacy. Patient has confirmed to keep her [facility] appointment on 4/25. No other inquiries during the call.  ?

## 2021-10-08 LAB — HM PAP SMEAR

## 2021-10-12 ENCOUNTER — Other Ambulatory Visit: Payer: Self-pay | Admitting: Family Medicine

## 2021-10-14 NOTE — Telephone Encounter (Signed)
Hold refill until 5/24 appt

## 2021-10-16 ENCOUNTER — Ambulatory Visit: Payer: No Typology Code available for payment source | Admitting: Family Medicine

## 2021-10-16 ENCOUNTER — Encounter: Payer: Self-pay | Admitting: Family Medicine

## 2021-10-16 VITALS — BP 116/78 | HR 64 | Ht 62.0 in | Wt 163.0 lb

## 2021-10-16 DIAGNOSIS — E782 Mixed hyperlipidemia: Secondary | ICD-10-CM

## 2021-10-16 DIAGNOSIS — E7800 Pure hypercholesterolemia, unspecified: Secondary | ICD-10-CM

## 2021-10-16 DIAGNOSIS — I10 Essential (primary) hypertension: Secondary | ICD-10-CM

## 2021-10-16 DIAGNOSIS — R7303 Prediabetes: Secondary | ICD-10-CM | POA: Diagnosis not present

## 2021-10-16 DIAGNOSIS — W57XXXA Bitten or stung by nonvenomous insect and other nonvenomous arthropods, initial encounter: Secondary | ICD-10-CM

## 2021-10-16 MED ORDER — VALSARTAN 80 MG PO TABS: 80.0000 mg | ORAL_TABLET | Freq: Every day | ORAL | 3 refills | Status: DC

## 2021-10-16 NOTE — Assessment & Plan Note (Signed)
She is not currently taking crestor.  Rechecking lipids in 1 month.

## 2021-10-16 NOTE — Progress Notes (Signed)
Gabriela Howard - 61 y.o. female MRN [id number]  Date of birth: [birth year]  Subjective No chief complaint on file.   HPI Gabriela Howard is a 61 y.o. female here today for follow up visit.  She is a former patient of Dr. Sheppard Coil.  She has a history of HTN, HLD, anxiety with insomnia.  Had a1c at her GYN office 06/2021 that was 6.0%.  She has been working on dietary changes.   HTN is currently managed with valsartan.  Tolerating well without side effects. Last labs 07/28/20.  BP is well controlled at this time.   She is prescribed rosuvastatin for management of HLD.  Her last lipid profile with LDL of 95.  She recently stopped rosuvastatin and wanted to see what cholesterol looks like without this.   She did have a recent tick bite while camping.  Tick was not engorged upon removal.  Mild redness around bite but no other symptoms.   ROS:  A comprehensive ROS was completed and negative except as noted per HPI   Allergies  Allergen Reactions   Aleve [Naproxen] Nausea And Vomiting and Nausea Only    Other reaction(s): Dizziness, sick,  Reaction:  GI upset    Aleve [Naproxen Sodium] Other (See Comments)    Reaction:  GI upset    Sulfamethoxazole-Trimethoprim     Other reaction(s): hives   Other Nausea Only    MEDICATION TO NUMB GUMS FOR DEEP CLEANING LIGHTHEADNESS, AND JITTERY PER PT.      Past Medical History:  Diagnosis Date   Anxiety    Colon polyps    Depression    High cholesterol    Insomnia    Uterine fibroid     Past Surgical History:  Procedure Laterality Date   ABDOMINAL HYSTERECTOMY  05/03/2016   LAVH, total   WRIST SURGERY      Social History   Socioeconomic History   Marital status: Married    Spouse name: Not on file   Number of children: 3   Years of education: Not on file   Highest education level: Not on file  Occupational History   Occupation: IT    Employer: [employer]  Tobacco Use   Smoking status: Former    Packs/day: 1.00     Years: 12.00    Pack years: 12.00    Types: Cigarettes    Quit date: 1994    Years since quitting: 29.4   Smokeless tobacco: Never  Vaping Use   Vaping Use: Never used  Substance and Sexual Activity   Alcohol use: Yes    Alcohol/week: 1.0 standard drink    Types: 1 Standard drinks or equivalent per week    Comment: occ   Drug use: No   Sexual activity: Not Currently    Birth control/protection: Surgical, Abstinence    Comment: LAVH  Other Topics Concern   Not on file  Social History Narrative   Not on file   Social Determinants of Health   Financial Resource Strain: Not on file  Food Insecurity: Not on file  Transportation Needs: Not on file  Physical Activity: Not on file  Stress: Not on file  Social Connections: Not on file    Family History  Problem Relation Age of Onset   Thyroid disease Mother        Blood clots   Hypertension Mother    Heart disease Father    Alzheimer's disease Father    High Cholesterol Father  Hyperlipidemia Brother    Prostate cancer Maternal Uncle     Health Maintenance  Topic Date Due   HIV Screening  Never done   Hepatitis C Screening  Never done   Zoster Vaccines- Shingrix (2 of 2) 01/06/2020   COVID-19 Vaccine (4 - Booster for Pfizer series) 02/19/2020   INFLUENZA VACCINE  12/24/2021   MAMMOGRAM  04/25/2022   COLONOSCOPY (Pts 45-76yr Insurance coverage will need to be confirmed)  09/16/2022   TETANUS/TDAP  07/12/2030   HPV VACCINES  Aged Out   PAP SMEAR-Modifier  Discontinued     ----------------------------------------------------------------------------------------------------------------------------------------------------------------------------------------------------------------- Physical Exam BP 116/78 (BP Location: Left Arm, Patient Position: Sitting, Cuff Size: Normal)   Pulse 64   Ht '5\' 2"'$  (1.575 m)   Wt 163 lb (73.9 kg)   LMP 04/05/2016   SpO2 99%   BMI 29.81 kg/m   Physical Exam Constitutional:       Appearance: Normal appearance.  Eyes:     General: No scleral icterus. Cardiovascular:     Rate and Rhythm: Normal rate and regular rhythm.  Pulmonary:     Effort: Pulmonary effort is normal.     Breath sounds: Normal breath sounds.  Musculoskeletal:     Cervical back: Neck supple.  Neurological:     Mental Status: She is alert.    ------------------------------------------------------------------------------------------------------------------------------------------------------------------------------------------------------------------- Assessment and Plan  Hypertension BP is well controlled with current dose of valsartan.  Continue at current strength.  Follow up in 6 months.  Updated labs ordered.   Prediabetes Previous a1c of 6.0%, updating today.   Pure hypercholesterolemia She is not currently taking crestor.  Rechecking lipids in 1 month.   Tick bite Based on time that tick was attached I don't think that she needs prophylactic antibiotics at this time.  She will monitor for symptoms.    Meds ordered this encounter  Medications   valsartan (DIOVAN) 80 MG tablet    Sig: Take 1 tablet (80 mg total) by mouth daily.    Dispense:  90 tablet    Refill:  3    No follow-ups on file.    This visit occurred during the SARS-CoV-2 public health emergency.  Safety protocols were in place, including screening questions prior to the visit, additional usage of staff PPE, and extensive cleaning of exam room while observing appropriate contact time as indicated for disinfecting solutions.

## 2021-10-16 NOTE — Patient Instructions (Signed)
Tick Bite Information, Adult  Ticks are insects that can bite. Most ticks live in shrubs and grassy areas. They climb onto people and animals that go by. Then they bite. Some ticks carry germs that can make you sick. How can I prevent tick bites? Take these steps: Use insect repellent Use an insect repellent that has 20% or higher of the ingredients DEET, picaridin, or IR3535. Follow the instructions on the label. Put it on: Bare skin. The tops of your boots. Your pant legs. The ends of your sleeves. If you use an insect repellent that has the ingredient permethrin, follow the instructions on the label. Put it on: Clothing. Boots. Supplies or outdoor gear. Tents. When you are outside Wear long sleeves and long pants. Wear light-colored clothes. Tuck your pant legs into your socks. Stay in the middle of the trail. Do not touch the bushes. Avoid walking through long grass. Check for ticks on your clothes, hair, and skin often while you are outside. Before going inside your house, check your clothes, skin, head, neck, armpits, waist, groin, and joint areas. When you go indoors Check your clothes for ticks. Dry your clothes in a dryer on high heat for 10 minutes or more. If clothes are damp, additional time may be needed. Wash your clothes right away if they need to be washed. Use hot water. Check your pets and outdoor gear. Shower right away. Check your body for ticks. Do a full body check using a mirror. What is the right way to remove a tick? Remove the tick from your skin as soon as possible. Do not remove the tick with your bare fingers. To remove a tick that is crawling on your skin: Go outdoors and brush the tick off. Use tape or a lint roller. To remove a tick that is biting: Wash your hands. If you have latex gloves, put them on. Use tweezers, curved forceps, or a tick-removal tool to grasp the tick. Grasp the tick as close to your skin and as close to the tick's head as  possible. Gently pull up until the tick lets go. Try to keep the tick's head attached to its body. Do not twist or jerk the tick. Do not squeeze or crush the tick. Do not try to remove a tick with heat, alcohol, petroleum jelly, or fingernail polish. What should I do after taking out a tick? Throw away the tick. Do not crush a tick with your fingers. Clean the bite area and your hands with soap and water, rubbing alcohol, or an iodine wash. If an antiseptic cream or ointment is available, apply a small amount to the bite area. Wash and disinfect any instruments that you used to remove the tick. How should I get rid of a live tick? To dispose of a live tick, use one of these methods: Place the tick in rubbing alcohol. Place the tick in a bag or container you can close tightly. Wrap the tick tightly in tape. Flush the tick down the toilet. Contact a doctor if: You have symptoms, such as: A fever or chills. A red rash that makes a circle (bull's-eye rash) in the bite area. Redness and swelling where the tick bit you. Headache. Pain in a muscle, joint, or bone. Being more tired than normal. Trouble walking or moving your legs. Numbness in your legs. Tender and swollen lymph glands. A part of a tick breaks off and gets stuck in your skin. Get help right away if: You cannot remove   a tick. You cannot move (have paralysis) or feel weak. You are feeling worse or have new symptoms. You find a tick that is biting you and filled with blood. This is important if you are in an area where diseases from ticks are common. Summary Ticks may carry germs that can make you sick. To prevent tick bites wear long sleeves, long pants, and light colors. Use insect repellent. Follow the instructions on the label. If the tick is biting, do not try to remove it with heat, alcohol, petroleum jelly, or fingernail polish. Use tweezers, curved forceps, or a tick-removal tool to grasp the tick. Gently pull up  until the tick lets go. Do not twist or jerk the tick. Do not squeeze or crush the tick. If you have symptoms, contact a doctor. This information is not intended to replace advice given to you by your health care provider. Make sure you discuss any questions you have with your health care provider. Document Revised: 05/09/2019 Document Reviewed: 05/09/2019 Elsevier Patient Education  2023 Elsevier Inc.  

## 2021-10-16 NOTE — Assessment & Plan Note (Signed)
Previous a1c of 6.0%, updating today.

## 2021-10-16 NOTE — Assessment & Plan Note (Signed)
BP is well controlled with current dose of valsartan.  Continue at current strength.  Follow up in 6 months.  Updated labs ordered.

## 2021-10-16 NOTE — Assessment & Plan Note (Signed)
Based on time that tick was attached I don't think that she needs prophylactic antibiotics at this time.  She will monitor for symptoms.

## 2021-10-17 ENCOUNTER — Encounter: Payer: Self-pay | Admitting: Family Medicine

## 2021-11-27 ENCOUNTER — Encounter: Payer: No Typology Code available for payment source | Admitting: Family Medicine

## 2022-02-20 ENCOUNTER — Ambulatory Visit: Payer: No Typology Code available for payment source | Admitting: Family Medicine

## 2022-03-04 ENCOUNTER — Telehealth: Payer: No Typology Code available for payment source | Admitting: Physician Assistant

## 2022-03-04 DIAGNOSIS — U071 COVID-19: Secondary | ICD-10-CM | POA: Diagnosis not present

## 2022-03-04 MED ORDER — BENZONATATE 100 MG PO CAPS: 100.0000 mg | ORAL_CAPSULE | Freq: Three times a day (TID) | ORAL | 0 refills | Status: DC | PRN

## 2022-03-04 MED ORDER — MOLNUPIRAVIR EUA 200MG CAPSULE
4.0000 | ORAL_CAPSULE | Freq: Two times a day (BID) | ORAL | 0 refills | Status: AC
Start: 2022-03-04 — End: 2022-03-09

## 2022-03-04 NOTE — Patient Instructions (Signed)
Gabriela Howard, thank you for joining Gabriela Rio, PA-C for today's virtual visit.  While this provider is not your primary care provider (PCP), if your PCP is located in our provider database this encounter information will be shared with them immediately following your visit.  Consent: (Patient) Gabriela Howard provided verbal consent for this virtual visit at the beginning of the encounter.  Current Medications:  Current Outpatient Medications:    Ascorbic Acid (VITAMIN C) 1000 MG tablet, Take 1,000 mg by mouth daily., Disp: , Rfl:    Cholecalciferol (VITAMIN D) 2000 units CAPS, Take 1 capsule by mouth daily., Disp: , Rfl:    Collagen Hydrolysate POWD, by Does not apply route., Disp: , Rfl:    Magnesium 500 MG CAPS, Take 1 capsule by mouth daily., Disp: , Rfl:    rosuvastatin (CRESTOR) 10 MG tablet, Take 1 tablet (10 mg total) by mouth daily., Disp: 90 tablet, Rfl: 3   valsartan (DIOVAN) 80 MG tablet, Take 1 tablet (80 mg total) by mouth daily., Disp: 90 tablet, Rfl: 3   Medications ordered in this encounter:  No orders of the defined types were placed in this encounter.    *If you need refills on other medications prior to your next appointment, please contact your pharmacy*  Follow-Up: Call back or seek an in-person evaluation if the symptoms worsen or if the condition fails to improve as anticipated.  [place] [phone]  Other Instructions Please keep well-hydrated and get plenty of rest. Start a saline nasal rinse to flush out your nasal passages. You can use plain Mucinex to help thin congestion. If you have a humidifier, running in the bedroom at night. I want you to start OTC vitamin D3 1000 units daily, vitamin C 1000 mg daily, and a zinc supplement. Please take prescribed medications as directed.  You have been enrolled in a MyChart symptom monitoring program. Please answer these questions daily so we can keep track of how you are  doing.  You were to quarantine for 5 days from onset of your symptoms.  After day 5, if you have had no fever and you are feeling better, you can end quarantine but need to mask for an additional 5 days. After day 5 if you have a fever or are having significant symptoms, please quarantine for full 10 days.  If you note any worsening of symptoms, any significant shortness of breath or any chest pain, please seek ER evaluation ASAP.  Please do not delay care!  COVID-19: What to Do if You Are Sick If you test positive and are an older adult or someone who is at high risk of getting very sick from COVID-19, treatment may be available. Contact a healthcare provider right away after a positive test to determine if you are eligible, even if your symptoms are mild right now. You can also visit a Test to Treat location and, if eligible, receive a prescription from a provider. Don't delay: Treatment must be started within the first few days to be effective. If you have a fever, cough, or other symptoms, you might have COVID-19. Most people have mild illness and are able to recover at home. If you are sick: Keep track of your symptoms. If you have an emergency warning sign (including trouble breathing), call 911. Steps to help prevent the spread of COVID-19 if you are sick If you are sick with COVID-19 or think you might have COVID-19, follow the steps below to care  for yourself and to help protect other people in your home and community. Stay home except to get medical care Stay home. Most people with COVID-19 have mild illness and can recover at home without medical care. Do not leave your home, except to get medical care. Do not visit public areas and do not go to places where you are unable to wear a mask. Take care of yourself. Get rest and stay hydrated. Take over-the-counter medicines, such as acetaminophen, to help you feel better. Stay in touch with your doctor. Call before you get medical care. Be  sure to get care if you have trouble breathing, or have any other emergency warning signs, or if you think it is an emergency. Avoid public transportation, ride-sharing, or taxis if possible. Get tested If you have symptoms of COVID-19, get tested. While waiting for test results, stay away from others, including staying apart from those living in your household. Get tested as soon as possible after your symptoms start. Treatments may be available for people with COVID-19 who are at risk for becoming very sick. Don't delay: Treatment must be started early to be effective--some treatments must begin within 5 days of your first symptoms. Contact your healthcare provider right away if your test result is positive to determine if you are eligible. Self-tests are one of several options for testing for the virus that causes COVID-19 and may be more convenient than laboratory-based tests and point-of-care tests. Ask your healthcare provider or your local health department if you need help interpreting your test results. You can visit your state, tribal, local, and territorial health department's website to look for the latest local information on testing sites. Separate yourself from other people As much as possible, stay in a specific room and away from other people and pets in your home. If possible, you should use a separate bathroom. If you need to be around other people or animals in or outside of the home, wear a well-fitting mask. Tell your close contacts that they may have been exposed to COVID-19. An infected person can spread COVID-19 starting 48 hours (or 2 days) before the person has any symptoms or tests positive. By letting your close contacts know they may have been exposed to COVID-19, you are helping to protect everyone. See COVID-19 and Animals if you have questions about pets. If you are diagnosed with COVID-19, someone from the health department may call you. Answer the call to slow the  spread. Monitor your symptoms Symptoms of COVID-19 include fever, cough, or other symptoms. Follow care instructions from your healthcare provider and local health department. Your local health authorities may give instructions on checking your symptoms and reporting information. When to seek emergency medical attention Look for emergency warning signs* for COVID-19. If someone is showing any of these signs, seek emergency medical care immediately: Trouble breathing Persistent pain or pressure in the chest New confusion Inability to wake or stay awake Pale, gray, or blue-colored skin, lips, or nail beds, depending on skin tone *This list is not all possible symptoms. Please call your medical provider for any other symptoms that are severe or concerning to you. Call 911 or call ahead to your local emergency facility: Notify the operator that you are seeking care for someone who has or may have COVID-19. Call ahead before visiting your doctor Call ahead. Many medical visits for routine care are being postponed or done by phone or telemedicine. If you have a medical appointment that cannot be postponed, call  your doctor's office, and tell them you have or may have COVID-19. This will help the office protect themselves and other patients. If you are sick, wear a well-fitting mask You should wear a mask if you must be around other people or animals, including pets (even at home). Wear a mask with the best fit, protection, and comfort for you. You don't need to wear the mask if you are alone. If you can't put on a mask (because of trouble breathing, for example), cover your coughs and sneezes in some other way. Try to stay at least 6 feet away from other people. This will help protect the people around you. Masks should not be placed on young children under age 47 years, anyone who has trouble breathing, or anyone who is not able to remove the mask without help. Cover your coughs and sneezes Cover  your mouth and nose with a tissue when you cough or sneeze. Throw away used tissues in a lined trash can. Immediately wash your hands with soap and water for at least 20 seconds. If soap and water are not available, clean your hands with an alcohol-based hand sanitizer that contains at least 60% alcohol. Clean your hands often Wash your hands often with soap and water for at least 20 seconds. This is especially important after blowing your nose, coughing, or sneezing; going to the bathroom; and before eating or preparing food. Use hand sanitizer if soap and water are not available. Use an alcohol-based hand sanitizer with at least 60% alcohol, covering all surfaces of your hands and rubbing them together until they feel dry. Soap and water are the best option, especially if hands are visibly dirty. Avoid touching your eyes, nose, and mouth with unwashed hands. Handwashing Tips Avoid sharing personal household items Do not share dishes, drinking glasses, cups, eating utensils, towels, or bedding with other people in your home. Wash these items thoroughly after using them with soap and water or put in the dishwasher. Clean surfaces in your home regularly Clean and disinfect high-touch surfaces (for example, doorknobs, tables, handles, light switches, and countertops) in your "sick room" and bathroom. In shared spaces, you should clean and disinfect surfaces and items after each use by the person who is ill. If you are sick and cannot clean, a caregiver or other person should only clean and disinfect the area around you (such as your bedroom and bathroom) on an as needed basis. Your caregiver/other person should wait as long as possible (at least several hours) and wear a mask before entering, cleaning, and disinfecting shared spaces that you use. Clean and disinfect areas that may have blood, stool, or body fluids on them. Use household cleaners and disinfectants. Clean visible dirty surfaces with  household cleaners containing soap or detergent. Then, use a household disinfectant. Use a product from [employer] List N: Disinfectants for Coronavirus (QQIWL-79). Be sure to follow the instructions on the label to ensure safe and effective use of the product. Many products recommend keeping the surface wet with a disinfectant for a certain period of time (look at "contact time" on the product label). You may also need to wear personal protective equipment, such as gloves, depending on the directions on the product label. Immediately after disinfecting, wash your hands with soap and water for 20 seconds. For completed guidance on cleaning and disinfecting your home, visit Complete Disinfection Guidance. Take steps to improve ventilation at home Improve ventilation (air flow) at home to help prevent from spreading COVID-19 to  other people in your household. Clear out COVID-19 virus particles in the air by opening windows, using air filters, and turning on fans in your home. Use this interactive tool to learn how to improve air flow in your home. When you can be around others after being sick with COVID-19 Deciding when you can be around others is different for different situations. Find out when you can safely end home isolation. For any additional questions about your care, contact your healthcare provider or state or local health department. 08/14/2020 Content source: [facility] ([facility]), Division of Viral Diseases This information is not intended to replace advice given to you by your health care provider. Make sure you discuss any questions you have with your health care provider. Document Revised: 09/27/2020 Document Reviewed: 09/27/2020 Elsevier Patient Education  2022 [employer].      If you have been instructed to have an in-person evaluation today at a local Urgent Care facility, please use the link below. It will take you to a list of all  of our available [facility], including address, phone number and hours of operation. Please do not delay care.  [facility]  If you or a family member do not have a primary care provider, use the link below to schedule a visit and establish care. When you choose a [facility] primary care physician or advanced practice provider, you gain a long-term partner in health. Find a Primary Care Provider  Learn more about [facility] in-office and virtual care options: [facility]

## 2022-03-04 NOTE — Progress Notes (Signed)
Virtual Visit Consent   Gabriela Howard, you are scheduled for a virtual visit with a [facility] provider today. Just as with appointments in the office, your consent must be obtained to participate. Your consent will be active for this visit and any virtual visit you may have with one of our providers in the next 365 days. If you have a MyChart account, a copy of this consent can be sent to you electronically.  As this is a virtual visit, video technology does not allow for your provider to perform a traditional examination. This may limit your provider's ability to fully assess your condition. If your provider identifies any concerns that need to be evaluated in person or the need to arrange testing (such as labs, EKG, etc.), we will make arrangements to do so. Although advances in technology are sophisticated, we cannot ensure that it will always work on either your end or our end. If the connection with a video visit is poor, the visit may have to be switched to a telephone visit. With either a video or telephone visit, we are not always able to ensure that we have a secure connection.  By engaging in this virtual visit, you consent to the provision of healthcare and authorize for your insurance to be billed (if applicable) for the services provided during this visit. Depending on your insurance coverage, you may receive a charge related to this service.  I need to obtain your verbal consent now. Are you willing to proceed with your visit today? Gabriela Howard has provided verbal consent on 03/04/2022 for a virtual visit (video or telephone). Leeanne Rio, [place]  Date: 03/04/2022 12:20 PM  Virtual Visit via Video Note   I, Leeanne Rio, connected with  Gabriela Howard  ([id number], 21-Apr-1961) on 03/04/22 at 12:15 PM EDT by a video-enabled telemedicine application and verified that I am speaking with the correct person using two identifiers.  Location: Patient: Virtual Visit  Location Patient: Home Provider: Virtual Visit Location Provider: Home Office   I discussed the limitations of evaluation and management by telemedicine and the availability of in person appointments. The patient expressed understanding and agreed to proceed.    History of Present Illness: Gabriela Howard is a 61 y.o. who identifies as a female who was assigned female at birth, and is being seen today for COVID-19. Notes symptoms starting overnight Friday into Saturday with nasal congestion, sore throat and cough. Learned about possible work exposure so tested x 2 on Saturday -- both negative. On Sunday had fever 101-102 that has since resolved. Notes continued URI symptoms now with fatigue and headache. Denies chest pain or SOB. Denies GI symptoms. Took another COVID test which came back positive. Is taking Coricidin-HBP OTC.    HPI: HPI  Problems:  Patient Active Problem List   Diagnosis Date Noted   Prediabetes 10/16/2021   Tick bite 10/16/2021   Wound infection 08/30/2020   Fever 08/30/2020   Metacarpal bone fracture 08/22/2020   S/P ORIF (open reduction internal fixation) fracture 08/22/2020   C. difficile diarrhea 08/22/2020   Hypertension 07/13/2020   Atherosclerosis 07/13/2020   Anxiety 07/13/2020   Pure hypercholesterolemia 07/13/2020   Injury of foot, right 05/17/2020   Hyperlipidemia 05/04/2020   Atypical squamous cells of undetermined significance (ASCUS) on Papanicolaou smear of cervix 03/23/2014   Vitamin deficiency syndrome 01/22/2011    Allergies:  Allergies  Allergen Reactions   Aleve [Naproxen] Nausea And Vomiting and Nausea Only  Other reaction(s): Dizziness, sick,  Reaction:  GI upset    Aleve [Naproxen Sodium] Other (See Comments)    Reaction:  GI upset    Sulfamethoxazole-Trimethoprim     Other reaction(s): hives   Other Nausea Only    MEDICATION TO NUMB GUMS FOR DEEP CLEANING LIGHTHEADNESS, AND JITTERY PER PT.     Medications:  Current Outpatient  Medications:    benzonatate (TESSALON) 100 MG capsule, Take 1 capsule (100 mg total) by mouth 3 (three) times daily as needed for cough., Disp: 30 capsule, Rfl: 0   molnupiravir EUA (LAGEVRIO) 200 mg CAPS capsule, Take 4 capsules (800 mg total) by mouth 2 (two) times daily for 5 days., Disp: 40 capsule, Rfl: 0   Ascorbic Acid (VITAMIN C) 1000 MG tablet, Take 1,000 mg by mouth daily., Disp: , Rfl:    Cholecalciferol (VITAMIN D) 2000 units CAPS, Take 1 capsule by mouth daily., Disp: , Rfl:    Collagen Hydrolysate POWD, by Does not apply route., Disp: , Rfl:    Magnesium 500 MG CAPS, Take 1 capsule by mouth daily., Disp: , Rfl:    rosuvastatin (CRESTOR) 10 MG tablet, Take 1 tablet (10 mg total) by mouth daily., Disp: 90 tablet, Rfl: 3   valsartan (DIOVAN) 80 MG tablet, Take 1 tablet (80 mg total) by mouth daily., Disp: 90 tablet, Rfl: 3  Observations/Objective: Patient is well-developed, well-nourished in no acute distress.  Resting comfortably at home.  Head is normocephalic, atraumatic.  No labored breathing. Speech is clear and coherent with logical content.  Patient is alert and oriented at baseline.   Assessment and Plan: 1. COVID-19 - MyChart COVID-19 home monitoring program; Future - benzonatate (TESSALON) 100 MG capsule; Take 1 capsule (100 mg total) by mouth 3 (three) times daily as needed for cough.  Dispense: 30 capsule; Refill: 0 - molnupiravir EUA (LAGEVRIO) 200 mg CAPS capsule; Take 4 capsules (800 mg total) by mouth 2 (two) times daily for 5 days.  Dispense: 40 capsule; Refill: 0  Patient with multiple risk factors for complicated course of illness. Discussed risks/benefits of antiviral medications including most common potential ADRs. Patient voiced understanding and would like to proceed with antiviral medication. They are candidate for Molnupiravir. Rx sent to pharmacy. Supportive measures, OTC medications and vitamin regimen reviewed. Tessalon per orders. Patient has been  enrolled in a MyChart COVID symptom monitoring program. Samule Dry reviewed in detail. Strict ER precautions discussed with patient.    Follow Up Instructions: I discussed the assessment and treatment plan with the patient. The patient was provided an opportunity to ask questions and all were answered. The patient agreed with the plan and demonstrated an understanding of the instructions.  A copy of instructions were sent to the patient via MyChart unless otherwise noted below.   The patient was advised to call back or seek an in-person evaluation if the symptoms worsen or if the condition fails to improve as anticipated.  Time:  I spent 10 minutes with the patient via telehealth technology discussing the above problems/concerns.    Leeanne Rio, PA-C

## 2022-03-18 DIAGNOSIS — T8484XA Pain due to internal orthopedic prosthetic devices, implants and grafts, initial encounter: Secondary | ICD-10-CM | POA: Insufficient documentation

## 2022-03-18 DIAGNOSIS — M1811 Unilateral primary osteoarthritis of first carpometacarpal joint, right hand: Secondary | ICD-10-CM | POA: Insufficient documentation

## 2022-03-20 ENCOUNTER — Telehealth: Payer: Self-pay | Admitting: Family Medicine

## 2022-03-20 DIAGNOSIS — R7303 Prediabetes: Secondary | ICD-10-CM

## 2022-03-20 DIAGNOSIS — E785 Hyperlipidemia, unspecified: Secondary | ICD-10-CM

## 2022-03-20 DIAGNOSIS — F419 Anxiety disorder, unspecified: Secondary | ICD-10-CM

## 2022-03-20 DIAGNOSIS — I10 Essential (primary) hypertension: Secondary | ICD-10-CM

## 2022-03-20 NOTE — Telephone Encounter (Signed)
Lab orders entered

## 2022-03-20 NOTE — Telephone Encounter (Signed)
Patient having surgery on 13th would like to be seen and lab work order sent prior to her appointment.

## 2022-03-26 ENCOUNTER — Ambulatory Visit: Payer: No Typology Code available for payment source | Admitting: Family Medicine

## 2022-03-27 ENCOUNTER — Other Ambulatory Visit: Payer: No Typology Code available for payment source

## 2022-03-28 LAB — HEMOGLOBIN A1C
Hgb A1c MFr Bld: 5.8 % of total Hgb — ABNORMAL HIGH (ref <5.7)
Mean Plasma Glucose: 120 mg/dL
eAG (mmol/L): 6.6 mmol/L

## 2022-03-28 LAB — COMPLETE METABOLIC PANEL WITH GFR
AG Ratio: 1.7 (calc) (ref 1.0–2.5)
ALT: 14 U/L (ref 6–29)
AST: 15 U/L (ref 10–35)
Albumin: 4 g/dL (ref 3.6–5.1)
Alkaline phosphatase (APISO): 71 U/L (ref 37–153)
BUN: 19 mg/dL (ref 7–25)
CO2: 29 mmol/L (ref 20–32)
Calcium: 9.4 mg/dL (ref 8.6–10.4)
Chloride: 106 mmol/L (ref 98–110)
Creat: 0.63 mg/dL (ref 0.50–1.05)
Globulin: 2.3 g/dL (calc) (ref 1.9–3.7)
Glucose, Bld: 96 mg/dL (ref 65–99)
Potassium: 5.3 mmol/L (ref 3.5–5.3)
Sodium: 143 mmol/L (ref 135–146)
Total Bilirubin: 0.4 mg/dL (ref 0.2–1.2)
Total Protein: 6.3 g/dL (ref 6.1–8.1)
eGFR: 101 mL/min/{1.73_m2} (ref >=60)

## 2022-03-28 LAB — CBC WITH DIFFERENTIAL/PLATELET
Absolute Monocytes: 350 cells/uL (ref 200–950)
Basophils Absolute: 42 cells/uL (ref 0–200)
Basophils Relative: 0.6 %
Eosinophils Absolute: 98 cells/uL (ref 15–500)
Eosinophils Relative: 1.4 %
HCT: 38 % (ref 35.0–45.0)
Hemoglobin: 12.9 g/dL (ref 11.7–15.5)
Lymphs Abs: 2814 cells/uL (ref 850–3900)
MCH: 29.5 pg (ref 27.0–33.0)
MCHC: 33.9 g/dL (ref 32.0–36.0)
MCV: 86.8 fL (ref 80.0–100.0)
MPV: 11.2 fL (ref 7.5–12.5)
Monocytes Relative: 5 %
Neutro Abs: 3696 cells/uL (ref 1500–7800)
Neutrophils Relative %: 52.8 %
Platelets: 238 10*3/uL (ref 140–400)
RBC: 4.38 10*6/uL (ref 3.80–5.10)
RDW: 12.4 % (ref 11.0–15.0)
Total Lymphocyte: 40.2 %
WBC: 7 10*3/uL (ref 3.8–10.8)

## 2022-03-28 LAB — LIPID PANEL W/REFLEX DIRECT LDL
Cholesterol: 228 mg/dL — ABNORMAL HIGH (ref <200)
HDL: 58 mg/dL (ref >=50)
LDL Cholesterol (Calc): 153 mg/dL (calc) — ABNORMAL HIGH
Non-HDL Cholesterol (Calc): 170 mg/dL (calc) — ABNORMAL HIGH (ref <130)
Total CHOL/HDL Ratio: 3.9 (calc) (ref <5.0)
Triglycerides: 72 mg/dL (ref <150)

## 2022-04-01 ENCOUNTER — Ambulatory Visit: Payer: No Typology Code available for payment source | Admitting: Family Medicine

## 2022-04-01 ENCOUNTER — Encounter: Payer: Self-pay | Admitting: Family Medicine

## 2022-04-01 VITALS — BP 114/77 | HR 69 | Ht 62.0 in | Wt 149.0 lb

## 2022-04-01 DIAGNOSIS — I10 Essential (primary) hypertension: Secondary | ICD-10-CM | POA: Diagnosis not present

## 2022-04-01 DIAGNOSIS — E7800 Pure hypercholesterolemia, unspecified: Secondary | ICD-10-CM | POA: Diagnosis not present

## 2022-04-01 DIAGNOSIS — R300 Dysuria: Secondary | ICD-10-CM | POA: Diagnosis not present

## 2022-04-01 DIAGNOSIS — R7303 Prediabetes: Secondary | ICD-10-CM

## 2022-04-01 DIAGNOSIS — I6523 Occlusion and stenosis of bilateral carotid arteries: Secondary | ICD-10-CM | POA: Insufficient documentation

## 2022-04-01 LAB — POCT URINALYSIS DIP (CLINITEK)
Bilirubin, UA: NEGATIVE
Blood, UA: NEGATIVE
Glucose, UA: NEGATIVE mg/dL
Ketones, POC UA: NEGATIVE mg/dL
Leukocytes, UA: NEGATIVE
Nitrite, UA: NEGATIVE
POC PROTEIN,UA: NEGATIVE
Spec Grav, UA: 1.01 (ref 1.010–1.025)
Urobilinogen, UA: 0.2 E.U./dL
pH, UA: 6 (ref 5.0–8.0)

## 2022-04-01 NOTE — Progress Notes (Signed)
Gabriela Howard - 61 y.o. female MRN [id number]  Date of birth: 1960-08-12  Subjective Chief Complaint  Patient presents with   Urinary Tract Infection    HPI Gabriela Howard is a 61 year old female here today for follow-up visit.  She has upcoming surgery on her thumb.  She would have to have labs prior to having surgery.  He also had some mild dysuria.  Wanted to be sure she did not have a UTI.  She denies any frequency or urgency.  No blood in her urine.  Her blood pressure has remained well controlled with valsartan at 80 mg.  She is interested in trying to reduce this.  She has been working on dietary change to help with her blood pressure.  She has been off of rosuvastatin.  Did not feel like she needed this.  Recent LDL elevated in the 150s.  She did not have any side effects when taking this previously.  ROS:  A comprehensive ROS was completed and negative except as noted per HPI  Allergies  Allergen Reactions   Aleve [Naproxen] Nausea And Vomiting and Nausea Only    Other reaction(s): Dizziness, sick,  Reaction:  GI upset    Aleve [Naproxen Sodium] Other (See Comments)    Reaction:  GI upset    Sulfamethoxazole-Trimethoprim     Other reaction(s): hives   Other Nausea Only    MEDICATION TO NUMB GUMS FOR DEEP CLEANING LIGHTHEADNESS, AND JITTERY PER PT.      Past Medical History:  Diagnosis Date   Anxiety    Colon polyps    Depression    High cholesterol    Insomnia    Uterine fibroid     Past Surgical History:  Procedure Laterality Date   ABDOMINAL HYSTERECTOMY  05/03/2016   LAVH, total   WRIST SURGERY      Social History   Socioeconomic History   Marital status: Married    Spouse name: Not on file   Number of children: 3   Years of education: Not on file   Highest education level: Not on file  Occupational History   Occupation: IT    Employer: [employer]  Tobacco Use   Smoking status: Former    Packs/day: 1.00    Years: 12.00    Total  pack years: 12.00    Types: Cigarettes    Quit date: 1994    Years since quitting: 29.8   Smokeless tobacco: Never  Vaping Use   Vaping Use: Never used  Substance and Sexual Activity   Alcohol use: Yes    Alcohol/week: 1.0 standard drink of alcohol    Types: 1 Standard drinks or equivalent per week    Comment: occ   Drug use: No   Sexual activity: Not Currently    Birth control/protection: Surgical, Abstinence    Comment: LAVH  Other Topics Concern   Not on file  Social History Narrative   Not on file   Social Determinants of Health   Financial Resource Strain: Not on file  Food Insecurity: Not on file  Transportation Needs: Not on file  Physical Activity: Not on file  Stress: Not on file  Social Connections: Not on file    Family History  Problem Relation Age of Onset   Thyroid disease Mother        Blood clots   Hypertension Mother    Heart disease Father    Alzheimer's disease Father    High Cholesterol Father    Hyperlipidemia  Brother    Prostate cancer Maternal Uncle     Health Maintenance  Topic Date Due   COVID-19 Vaccine (4 - Pfizer series) 06/26/2022 (Originally 02/19/2020)   Zoster Vaccines- Shingrix (2 of 2) 07/02/2022 (Originally 01/06/2020)   INFLUENZA VACCINE  08/24/2022 (Originally 12/24/2021)   Hepatitis C Screening  04/02/2023 (Originally 03/07/1979)   HIV Screening  04/02/2023 (Originally 03/06/1976)   MAMMOGRAM  04/25/2022   COLONOSCOPY (Pts 45-34yr Insurance coverage will need to be confirmed)  09/16/2022   TETANUS/TDAP  07/12/2030   HPV VACCINES  Aged Out   PAP SMEAR-Modifier  Discontinued     ----------------------------------------------------------------------------------------------------------------------------------------------------------------------------------------------------------------- Physical Exam BP 114/77 (BP Location: Left Arm, Patient Position: Sitting, Cuff Size: Normal)   Pulse 69   Ht '5\' 2"'$  (1.575 m)   Wt 149 lb  (67.6 kg)   LMP 04/05/2016   SpO2 98%   BMI 27.25 kg/m   Physical Exam Constitutional:      Appearance: Normal appearance.  HENT:     Head: Normocephalic and atraumatic.  Eyes:     General: No scleral icterus. Neurological:     Mental Status: She is alert.  Psychiatric:        Mood and Affect: Mood normal.        Behavior: Behavior normal.     ------------------------------------------------------------------------------------------------------------------------------------------------------------------------------------------------------------------- Assessment and Plan  Hypertension Blood pressures are quite well controlled at this time.  She can try reducing her valsartan to half tab (40 mg) daily.  Continue to monitor blood pressure at home.  Pure hypercholesterolemia LDL remains elevated.  Recommend that she restart rosuvastatin, she can take this every other day for now.  Prediabetes Recent A1c has improved.  Encouraged to work on dietary changes to help with management of her blood sugars.  Burning with urination Urinalysis does not show any findings concerning for UTI.  She will let me know if not improving.   No orders of the defined types were placed in this encounter.   No follow-ups on file.    This visit occurred during the SARS-CoV-2 public health emergency.  Safety protocols were in place, including screening questions prior to the visit, additional usage of staff PPE, and extensive cleaning of exam room while observing appropriate contact time as indicated for disinfecting solutions.

## 2022-04-01 NOTE — Assessment & Plan Note (Signed)
LDL remains elevated.  Recommend that she restart rosuvastatin, she can take this every other day for now.

## 2022-04-01 NOTE — Patient Instructions (Addendum)
Reduce valsartan to 1/2 tab daily Take rosuvastatin every other day. Hope all goes well with your surgery!

## 2022-04-01 NOTE — Assessment & Plan Note (Signed)
Urinalysis does not show any findings concerning for UTI.  She will let me know if not improving.

## 2022-04-01 NOTE — Assessment & Plan Note (Signed)
Recent A1c has improved.  Encouraged to work on dietary changes to help with management of her blood sugars.

## 2022-04-01 NOTE — Assessment & Plan Note (Signed)
Blood pressures are quite well controlled at this time.  She can try reducing her valsartan to half tab (40 mg) daily.  Continue to monitor blood pressure at home.

## 2022-04-21 ENCOUNTER — Ambulatory Visit: Payer: No Typology Code available for payment source | Admitting: Family Medicine

## 2022-07-03 ENCOUNTER — Other Ambulatory Visit: Payer: Self-pay

## 2022-07-03 DIAGNOSIS — E785 Hyperlipidemia, unspecified: Secondary | ICD-10-CM

## 2022-07-03 MED ORDER — ROSUVASTATIN CALCIUM 10 MG PO TABS: 10.0000 mg | ORAL_TABLET | Freq: Every day | ORAL | 3 refills | Status: DC

## 2022-08-18 ENCOUNTER — Ambulatory Visit: Payer: No Typology Code available for payment source | Admitting: Family Medicine

## 2022-12-31 ENCOUNTER — Ambulatory Visit: Payer: No Typology Code available for payment source | Admitting: Family Medicine

## 2023-04-22 LAB — HM MAMMOGRAPHY

## 2023-05-07 ENCOUNTER — Ambulatory Visit: Payer: Self-pay | Admitting: Family Medicine

## 2023-05-07 ENCOUNTER — Ambulatory Visit: Payer: No Typology Code available for payment source | Admitting: Family Medicine

## 2023-05-07 NOTE — Telephone Encounter (Signed)
  Chief Complaint: Information Only-Appointment Reschedule Disposition: [] ED /[] Urgent Care (no appt availability in office) / [] Appointment(In office/virtual)/ []  [facility]/ [] Home Care/ [] Refused Recommended Disposition /[] [facility]/ [x]  Follow-up with PCP Additional Notes: System Issues not allowing for Telephonic Encounter, Patient desires to be rescheduled for appointment today. Routing to clinic for collaboration on issue resolving.

## 2023-05-13 ENCOUNTER — Ambulatory Visit: Payer: No Typology Code available for payment source | Admitting: Family Medicine

## 2023-05-13 ENCOUNTER — Ambulatory Visit: Payer: No Typology Code available for payment source | Attending: Family Medicine

## 2023-05-13 ENCOUNTER — Ambulatory Visit (HOSPITAL_BASED_OUTPATIENT_CLINIC_OR_DEPARTMENT_OTHER): Payer: No Typology Code available for payment source | Admitting: Family Medicine

## 2023-05-13 ENCOUNTER — Encounter: Payer: Self-pay | Admitting: Family Medicine

## 2023-05-13 VITALS — BP 121/82 | HR 73 | Ht 62.0 in | Wt 168.0 lb

## 2023-05-13 DIAGNOSIS — Z1211 Encounter for screening for malignant neoplasm of colon: Secondary | ICD-10-CM | POA: Diagnosis not present

## 2023-05-13 DIAGNOSIS — R7303 Prediabetes: Secondary | ICD-10-CM

## 2023-05-13 DIAGNOSIS — R002 Palpitations: Secondary | ICD-10-CM | POA: Insufficient documentation

## 2023-05-13 DIAGNOSIS — E559 Vitamin D deficiency, unspecified: Secondary | ICD-10-CM

## 2023-05-13 DIAGNOSIS — Z860100 Personal history of colon polyps, unspecified: Secondary | ICD-10-CM | POA: Insufficient documentation

## 2023-05-13 NOTE — Progress Notes (Signed)
Gabriela Howard - 62 y.o. female MRN [id number]  Date of birth: [birth year]  Subjective Chief Complaint  Patient presents with   Palpitations    HPI Gabriela Howard is a 62 y.o. female here today with complaint of palpitations and feeling like her heart is pounding.  This occurs at random times but she has this nearly every day.  She has not had chest pain or tightness.  She has seen cardiology in the past.  She had ECHO in 2023 which was normal.  She has not had holter monitor.   ROS:  A comprehensive ROS was completed and negative except as noted per HPI  Allergies  Allergen Reactions   Aleve [Naproxen] Nausea And Vomiting and Nausea Only    Other reaction(s): Dizziness, sick,  Reaction:  GI upset    Aleve [Naproxen Sodium] Other (See Comments)    Reaction:  GI upset    Sulfamethoxazole-Trimethoprim     Other reaction(s): hives   Other Nausea Only    MEDICATION TO NUMB GUMS FOR DEEP CLEANING LIGHTHEADNESS, AND JITTERY PER PT.      Past Medical History:  Diagnosis Date   Anxiety    Colon polyps    Depression    High cholesterol    Insomnia    Uterine fibroid     Past Surgical History:  Procedure Laterality Date   ABDOMINAL HYSTERECTOMY  05/03/2016   LAVH, total   WRIST SURGERY      Social History   Socioeconomic History   Marital status: Married    Spouse name: Not on file   Number of children: 3   Years of education: Not on file   Highest education level: Not on file  Occupational History   Occupation: IT    Employer: [employer]  Tobacco Use   Smoking status: Former    Current packs/day: 0.00    Average packs/day: 1 pack/day for 12.0 years (12.0 ttl pk-yrs)    Types: Cigarettes    Start date: 65    Quit date: 1994    Years since quitting: 30.9   Smokeless tobacco: Never  Vaping Use   Vaping status: Never Used  Substance and Sexual Activity   Alcohol use: Yes    Alcohol/week: 1.0 standard drink of alcohol    Types: 1 Standard  drinks or equivalent per week    Comment: occ   Drug use: No   Sexual activity: Not Currently    Birth control/protection: Surgical, Abstinence    Comment: LAVH  Other Topics Concern   Not on file  Social History Narrative   Not on file   Social Drivers of Health   Financial Resource Strain: Not on file  Food Insecurity: Not on file  Transportation Needs: Not on file  Physical Activity: Not on file  Stress: No Stress Concern Present (07/23/2020)   Received from [facility], [facility] - Occupational Stress Questionnaire    Feeling of Stress : Not at all  Social Connections: Unknown (10/04/2021)   Received from [facility], [facility]   Social Network    Social Network: Not on file    Family History  Problem Relation Age of Onset   Thyroid disease Mother        Blood clots   Hypertension Mother    Heart disease Father    Alzheimer's disease Father    High Cholesterol Father    Hyperlipidemia Brother    Prostate cancer  Maternal Uncle     Health Maintenance  Topic Date Due   HIV Screening  Never done   Hepatitis C Screening  Never done   Cervical Cancer Screening (HPV/Pap Cotest)  Never done   Zoster Vaccines- Shingrix (2 of 2) 01/06/2020   MAMMOGRAM  04/25/2022   Colonoscopy  09/16/2022   COVID-19 Vaccine (4 - 2024-25 season) 01/25/2023   INFLUENZA VACCINE  08/24/2023 (Originally 12/25/2022)   DTaP/Tdap/Td (3 - Td or Tdap) 07/12/2030   HPV VACCINES  Aged Out     ----------------------------------------------------------------------------------------------------------------------------------------------------------------------------------------------------------------- Physical Exam BP 121/82 (BP Location: Left Arm, Patient Position: Sitting, Cuff Size: Normal)   Pulse 73   Ht 5\' 2"  (1.575 m)   Wt 168 lb (76.2 kg)   LMP 04/05/2016   SpO2 97%   BMI 30.73 kg/m   Physical Exam Constitutional:       Appearance: Normal appearance.  Eyes:     General: No scleral icterus. Cardiovascular:     Rate and Rhythm: Normal rate and regular rhythm.  Pulmonary:     Effort: Pulmonary effort is normal.     Breath sounds: Normal breath sounds.  Musculoskeletal:     Cervical back: Neck supple.  Neurological:     Mental Status: She is alert.  Psychiatric:        Mood and Affect: Mood normal.        Behavior: Behavior normal.      EKG: NSR, normal PR and Qtc intervals.   ------------------------------------------------------------------------------------------------------------------------------------------------------------------------------------------------------------------- Assessment and Plan  Palpitations She continues to have sensation of heart racing and palpitations. Update labs today will check mag levels and ferritin.  Zio patch ordered.    No orders of the defined types were placed in this encounter.   No follow-ups on file.    This visit occurred during the SARS-CoV-2 public health emergency.  Safety protocols were in place, including screening questions prior to the visit, additional usage of staff PPE, and extensive cleaning of exam room while observing appropriate contact time as indicated for disinfecting solutions.

## 2023-05-13 NOTE — Assessment & Plan Note (Signed)
She continues to have sensation of heart racing and palpitations. Update labs today will check mag levels and ferritin.  Zio patch ordered.

## 2023-05-14 LAB — CBC WITH DIFFERENTIAL/PLATELET
Basophils Absolute: 0.1 10*3/uL (ref 0.0–0.2)
Basos: 1 %
EOS (ABSOLUTE): 0.2 10*3/uL (ref 0.0–0.4)
Eos: 2 %
Hematocrit: 39.9 % (ref 34.0–46.6)
Hemoglobin: 12.9 g/dL (ref 11.1–15.9)
Immature Grans (Abs): 0 10*3/uL (ref 0.0–0.1)
Immature Granulocytes: 0 %
Lymphocytes Absolute: 4.9 10*3/uL — ABNORMAL HIGH (ref 0.7–3.1)
Lymphs: 44 %
MCH: 28.5 pg (ref 26.6–33.0)
MCHC: 32.3 g/dL (ref 31.5–35.7)
MCV: 88 fL (ref 79–97)
Monocytes Absolute: 0.6 10*3/uL (ref 0.1–0.9)
Monocytes: 6 %
Neutrophils Absolute: 5.3 10*3/uL (ref 1.4–7.0)
Neutrophils: 47 %
Platelets: 332 10*3/uL (ref 150–450)
RBC: 4.52 x10E6/uL (ref 3.77–5.28)
RDW: 12.1 % (ref 11.7–15.4)
WBC: 11.1 10*3/uL — ABNORMAL HIGH (ref 3.4–10.8)

## 2023-05-14 LAB — HEMOGLOBIN A1C
Est. average glucose Bld gHb Est-mCnc: 123 mg/dL
Hgb A1c MFr Bld: 5.9 % — ABNORMAL HIGH (ref 4.8–5.6)

## 2023-05-14 LAB — BASIC METABOLIC PANEL
BUN/Creatinine Ratio: 17 (ref 12–28)
BUN: 11 mg/dL (ref 8–27)
CO2: 25 mmol/L (ref 20–29)
Calcium: 9.3 mg/dL (ref 8.7–10.3)
Chloride: 106 mmol/L (ref 96–106)
Creatinine, Ser: 0.63 mg/dL (ref 0.57–1.00)
Glucose: 87 mg/dL (ref 70–99)
Potassium: 4.6 mmol/L (ref 3.5–5.2)
Sodium: 143 mmol/L (ref 134–144)
eGFR: 100 mL/min/{1.73_m2} (ref >59)

## 2023-05-14 LAB — IRON,TIBC AND FERRITIN PANEL
Ferritin: 232 ng/mL — ABNORMAL HIGH (ref 15–150)
Iron Saturation: 16 % (ref 15–55)
Iron: 42 ug/dL (ref 27–139)
Total Iron Binding Capacity: 268 ug/dL (ref 250–450)
UIBC: 226 ug/dL (ref 118–369)

## 2023-05-14 LAB — VITAMIN D 25 HYDROXY (VIT D DEFICIENCY, FRACTURES): Vit D, 25-Hydroxy: 35.3 ng/mL (ref 30.0–100.0)

## 2023-05-14 LAB — MAGNESIUM: Magnesium: 2.2 mg/dL (ref 1.6–2.3)

## 2023-05-14 LAB — TSH: TSH: 1.29 u[IU]/mL (ref 0.450–4.500)

## 2023-05-14 NOTE — Progress Notes (Unsigned)
EP to read

## 2023-05-25 ENCOUNTER — Other Ambulatory Visit: Payer: Self-pay | Admitting: Family Medicine

## 2023-05-25 DIAGNOSIS — D72829 Elevated white blood cell count, unspecified: Secondary | ICD-10-CM

## 2023-05-29 DIAGNOSIS — R002 Palpitations: Secondary | ICD-10-CM

## 2023-07-07 ENCOUNTER — Telehealth: Payer: Self-pay

## 2023-07-07 DIAGNOSIS — R300 Dysuria: Secondary | ICD-10-CM

## 2023-07-07 NOTE — Telephone Encounter (Signed)
Copied from CRM [id number]. Topic: Clinical - Request for Lab/Test Order >> Jul 07, 2023 11:14 AM Martha Clan wrote: Reason for CRM: Requesting labs for urine test for infection tied in with blood work scheduled for Feb. 13th

## 2023-07-08 NOTE — Telephone Encounter (Signed)
Patient states  she has been having some burning with Urination x 1 month varies in severity.  Since she is coming in for repeat CBC due to elevated WBC on last lab work she is hoping to get this tested as well.  She states she does have an appt with DR. Ashley Royalty on Monday 07/13/23 as well.

## 2023-07-09 NOTE — Telephone Encounter (Signed)
Attempted call to patient. Left a voice mail message requesting a return call.

## 2023-07-09 NOTE — Telephone Encounter (Signed)
Copied from CRM [id number]. Topic: Clinical - Lab/Test Results >> Jul 09, 2023 12:04 PM Gery Pray wrote: Reason for CRM: Alvena missed her appointment to have her labs done today 02/13 due to car trouble. She would like to have them rescheduled for Monday at 8 am or as soon as they open. Patient can be reached at [phone] for appointment confirmation.          Spoke with the pt to advise her she didn't have to have an appt. To have her labs drawn. She voiced her understanding and will come in on Mon. 07/13/23.

## 2023-07-10 NOTE — Telephone Encounter (Signed)
Patient informed of no need for appt for lab work to be drawn .

## 2023-07-13 ENCOUNTER — Ambulatory Visit: Payer: No Typology Code available for payment source | Admitting: Family Medicine

## 2023-07-13 ENCOUNTER — Other Ambulatory Visit: Payer: Self-pay

## 2023-07-13 DIAGNOSIS — R300 Dysuria: Secondary | ICD-10-CM

## 2023-07-14 LAB — URINALYSIS, ROUTINE W REFLEX MICROSCOPIC
Bilirubin, UA: NEGATIVE
Glucose, UA: NEGATIVE
Ketones, UA: NEGATIVE
Nitrite, UA: NEGATIVE
Protein,UA: NEGATIVE
RBC, UA: NEGATIVE
Specific Gravity, UA: 1.015 (ref 1.005–1.030)
Urobilinogen, Ur: 0.2 mg/dL (ref 0.2–1.0)
pH, UA: 6 (ref 5.0–7.5)

## 2023-07-14 LAB — CBC WITH DIFFERENTIAL/PLATELET
Basophils Absolute: 0 10*3/uL (ref 0.0–0.2)
Basos: 0 %
EOS (ABSOLUTE): 0.6 10*3/uL — ABNORMAL HIGH (ref 0.0–0.4)
Eos: 7 %
Hematocrit: 43 % (ref 34.0–46.6)
Hemoglobin: 14 g/dL (ref 11.1–15.9)
Immature Grans (Abs): 0 10*3/uL (ref 0.0–0.1)
Immature Granulocytes: 0 %
Lymphocytes Absolute: 3.3 10*3/uL — ABNORMAL HIGH (ref 0.7–3.1)
Lymphs: 37 %
MCH: 28.3 pg (ref 26.6–33.0)
MCHC: 32.6 g/dL (ref 31.5–35.7)
MCV: 87 fL (ref 79–97)
Monocytes Absolute: 0.6 10*3/uL (ref 0.1–0.9)
Monocytes: 6 %
Neutrophils Absolute: 4.5 10*3/uL (ref 1.4–7.0)
Neutrophils: 50 %
Platelets: 269 10*3/uL (ref 150–450)
RBC: 4.95 x10E6/uL (ref 3.77–5.28)
RDW: 12.2 % (ref 11.7–15.4)
WBC: 9 10*3/uL (ref 3.4–10.8)

## 2023-07-14 LAB — MICROSCOPIC EXAMINATION
Bacteria, UA: NONE SEEN
Casts: NONE SEEN /[LPF]
RBC, Urine: NONE SEEN /[HPF] (ref 0–2)

## 2023-07-17 ENCOUNTER — Encounter: Payer: Self-pay | Admitting: Family Medicine

## 2023-07-20 ENCOUNTER — Ambulatory Visit: Payer: No Typology Code available for payment source | Admitting: Family Medicine

## 2023-07-20 ENCOUNTER — Ambulatory Visit: Payer: Self-pay | Admitting: Family Medicine

## 2023-07-20 NOTE — Telephone Encounter (Signed)
 Chief Complaint: headache Symptoms: headache, lightheadedness, nausea Frequency: 2 days Pertinent Negatives: Patient denies CP, SOB, weakness Disposition: [x] ED /[] Urgent Care (no appt availability in office) / [] Appointment(In office/virtual)/ []  [facility]/ [] Home Care/ [] Refused Recommended Disposition /[]  Mobile Bus/ []  Follow-up with PCP Additional Notes: Pt reports headache for 2 days. Has an office visit for 1400 today that she called in to cancel due to not feeling well enough to drive. States this is unusual and she hardly ever has a headache. Denies hx of migraines. Pt denies blurry vision and dizziness but states she is lightheaded and nauseous. Has not vomited. States she had to sit down on the floor yesterday in the grocery store because she felt unsteady. Pt states she took Tylenol yesterday for the headache which helped a little, but the headache came right back. Pt also mentions that 3 weeks she was trying to close the door on a piece of cargo equipment on her car when it flew back and hit her in the face. Endorses black eyes with that and bruising to the nose. Nasal bridge tender to the touch. Denies LOC at that time. Denies headaches or any symptoms between that time 3 wks ago and yesterday. Headache yesterday was sudden onset . RN asked pt about valsaratan - pt states she has not taken it in a month. States she felt like her BP was controlled and that the medication wasn't necessary. RN asked pt to check her BP and it was 150/90. Pt states sometimes when she checks it it's high like that. Per protocol, given pt's severe headache with nausea, lightheadedness, and not having taken BP meds for 1 month, RN advised the ED. Pt agreeable, states she will call her husband and have him take her. RN advised pt if she begins vomiting, if the headache becomes more severe, if she has weakness, unable to walk, CP, SOB, any worsening she needs to call 911 and she verbalized  understanding.  Pt has an appt in the office today at 1400 that she was calling to cancel d/t not feeling well enough to drive. RN called the CAL to inform them that patient needs to cancel that appt. CAL was unavailable. RN cancelled appt.   Copied from CRM [id number]. Topic: Clinical - Red Word Triage >> Jul 20, 2023 11:14 AM Elle L wrote: Red Word that prompted transfer to Nurse Triage: The patient was originally calling to cancel her appointment. However, she states that she has had a severe migraine for 2 days that is making her nauseous and unable to drive. Reason for Disposition  [1] SEVERE headache AND [2] sudden-onset (i.e., reaching maximum intensity within seconds to 1 hour)  Answer Assessment - Initial Assessment Questions 1. LOCATION: "Where does it hurt?"      Front 2. ONSET: "When did the headache start?" (Minutes, hours or days)      Yesterday pain started  3. PATTERN: "Does the pain come and go, or has it been constant since it started?"     Constant, yesterday was constant too as well. Tylenol did help but it took a couple hours  4. SEVERITY: "How bad is the pain?" and "What does it keep you from doing?"  (e.g., Scale 1-10; mild, moderate, or severe)   - MILD (1-3): doesn't interfere with normal activities    - MODERATE (4-7): interferes with normal activities or awakens from sleep    - SEVERE (8-10): excruciating pain, unable to do any normal activities  7/10 5. RECURRENT SYMPTOM: "Have you ever had headaches before?" If Yes, ask: "When was the last time?" and "What happened that time?"      First headache since injury 3 wks ago, very rarely gets headaches (once in a while) 6. CAUSE: "What do you think is causing the headache?"     Not sure  7. MIGRAINE: "Have you been diagnosed with migraine headaches?" If Yes, ask: "Is this headache similar?"      No 8. HEAD INJURY: "Has there been any recent injury to the head?"      3 weeks ago - black eyes, nose was black and  blue. Pain to the bridge of the nose. Cargo area under RV is hard to pull closed, pt let go, it came and struck pt.  9. OTHER SYMPTOMS: "Do you have any other symptoms?" (fever, stiff neck, eye pain, sore throat, cold symptoms)     No light sensitivity. No flu-like symptoms. No blurry vision. "I was getting that feeling where you see a little flash in the corner of your eye, hasn't happened in a week." No dizziness, "just feeling like I have to sit down. Not feeling steady." No CP or SOB. Endorses lightheadedness and nausea since yesterday, pt was in the grocery store. Pt had a headache in the store, felt nauseous, had to sit down. No vomiting. No diarrhea or abdominal pain. No LOC 3 wks ago when the door fell on her face. No thinners. No CP. Has had vertigo before, this feels similar. Has not had valsartan in a month. Checks BP once in a while. 150/90 on the phone with RN.  Answer Assessment - Initial Assessment Questions 1. BLOOD PRESSURE: "What is the blood pressure?" "Did you take at least two measurements 5 minutes apart?"     150/90 2. ONSET: "When did you take your blood pressure?"     Just now 3. HOW: "How did you take your blood pressure?" (e.g., automatic home BP monitor, visiting nurse)     Automatic cuff 4. HISTORY: "Do you have a history of high blood pressure?"     Yes 5. MEDICINES: "Are you taking any medicines for blood pressure?" "Have you missed any doses recently?"     Valsartan 6. OTHER SYMPTOMS: "Do you have any symptoms?" (e.g., blurred vision, chest pain, difficulty breathing, headache, weakness)     Nausea, lightheadedness, headache  Protocols used: Headache-A-AH, Blood Pressure - High-A-AH

## 2023-07-30 ENCOUNTER — Inpatient Hospital Stay: Payer: No Typology Code available for payment source | Admitting: Family Medicine

## 2023-08-13 ENCOUNTER — Ambulatory Visit: Admitting: Family Medicine

## 2023-08-13 ENCOUNTER — Encounter: Payer: Self-pay | Admitting: Family Medicine

## 2023-08-13 VITALS — BP 98/62 | HR 75 | Ht 62.0 in | Wt 171.0 lb

## 2023-08-13 DIAGNOSIS — F419 Anxiety disorder, unspecified: Secondary | ICD-10-CM | POA: Diagnosis not present

## 2023-08-13 MED ORDER — ALPRAZOLAM 0.5 MG PO TABS: 0.2500 mg | ORAL_TABLET | Freq: Every evening | ORAL | 0 refills | Status: DC | PRN

## 2023-08-13 NOTE — Progress Notes (Signed)
 Gabriela Howard - 63 y.o. female MRN [id number]  Date of birth: February 16, 1961  Subjective Chief Complaint  Patient presents with   Medical Management of Chronic Issues    HPI Gabriela Howard is a 63 year old female here today for follow-up.  Seen in the ED for elevated blood pressure and headache at the end of February.  Reports being under much more stress recently.  She has recently purchased a house and there has been some problems with this.  Additionally she had to have her dog euthanized.  Having increased difficulty with sleep at night due to stress.  She has had some issues with insomnia in the past and tried Ambien at 1 point but did not like the side effects from this.  Trazodone was not effective.  She is requesting something she can use on an as-needed basis for a few weeks.  Her blood pressure is better controlled, actually a little low today.  She denies symptoms including dizziness or lightheadedness.  ROS:  A comprehensive ROS was completed and negative except as noted per HPI  Allergies  Allergen Reactions   Aleve [Naproxen] Nausea And Vomiting and Nausea Only    Other reaction(s): Dizziness, sick,  Reaction:  GI upset    Aleve [Naproxen Sodium] Other (See Comments)    Reaction:  GI upset    Sulfamethoxazole-Trimethoprim     Other reaction(s): hives   Other Nausea Only    MEDICATION TO NUMB GUMS FOR DEEP CLEANING LIGHTHEADNESS, AND JITTERY PER PT.      Past Medical History:  Diagnosis Date   Anxiety    Colon polyps    Depression    High cholesterol    Insomnia    Uterine fibroid     Past Surgical History:  Procedure Laterality Date   ABDOMINAL HYSTERECTOMY  05/03/2016   LAVH, total   WRIST SURGERY      Social History   Socioeconomic History   Marital status: Married    Spouse name: Not on file   Number of children: 3   Years of education: Not on file   Highest education level: Not on file  Occupational History   Occupation: IT    Employer:  [employer]  Tobacco Use   Smoking status: Former    Current packs/day: 0.00    Average packs/day: 1 pack/day for 12.0 years (12.0 ttl pk-yrs)    Types: Cigarettes    Start date: 59    Quit date: 1994    Years since quitting: 31.2   Smokeless tobacco: Never  Vaping Use   Vaping status: Never Used  Substance and Sexual Activity   Alcohol use: Yes    Alcohol/week: 1.0 standard drink of alcohol    Types: 1 Standard drinks or equivalent per week    Comment: occ   Drug use: No   Sexual activity: Not Currently    Birth control/protection: Surgical, Abstinence    Comment: LAVH  Other Topics Concern   Not on file  Social History Narrative   Not on file   Social Drivers of Health   Financial Resource Strain: Not on file  Food Insecurity: Not on file  Transportation Needs: Not on file  Physical Activity: Not on file  Stress: No Stress Concern Present (07/23/2020)   Received from [facility], [facility]   [employer] of Occupational Health - Occupational Stress Questionnaire    Feeling of Stress : Not at all  Social Connections: Unknown (10/04/2021)   [facility]   Social Network    Social Network: Not on file    Family History  Problem Relation Age of Onset   Thyroid disease Mother        Blood clots   Hypertension Mother    Heart disease Father    Alzheimer's disease Father    High Cholesterol Father    Hyperlipidemia Brother    Prostate cancer Maternal Uncle     Health Maintenance  Topic Date Due   HIV Screening  Never done   Hepatitis C Screening  Never done   Cervical Cancer Screening (HPV/Pap Cotest)  Never done   Zoster Vaccines- Shingrix (2 of 2) 01/06/2020   MAMMOGRAM  04/25/2022   Colonoscopy  09/16/2022   COVID-19 Vaccine (4 - 2024-25 season) 01/25/2023   INFLUENZA VACCINE  08/24/2023 (Originally 12/25/2022)   DTaP/Tdap/Td (3 - Td or Tdap) 07/12/2030   HPV VACCINES  Aged Out      ----------------------------------------------------------------------------------------------------------------------------------------------------------------------------------------------------------------- Physical Exam BP 98/62 (BP Location: Left Arm, Patient Position: Sitting, Cuff Size: Normal)   Pulse 75   Ht 5\' 2"  (1.575 m)   Wt 171 lb (77.6 kg)   LMP 04/05/2016   SpO2 98%   BMI 31.28 kg/m   Physical Exam Constitutional:      Appearance: Normal appearance.  HENT:     Head: Normocephalic and atraumatic.  Eyes:     General: No scleral icterus. Cardiovascular:     Rate and Rhythm: Normal rate and regular rhythm.  Pulmonary:     Effort: Pulmonary effort is normal.     Breath sounds: Normal breath sounds.  Musculoskeletal:     Cervical back: Neck supple.  Neurological:     Mental Status: She is alert.  Psychiatric:        Mood and Affect: Mood normal.        Behavior: Behavior normal.     ------------------------------------------------------------------------------------------------------------------------------------------------------------------------------------------------------------------- Assessment and Plan  Anxiety She has had increased stress and anxiety with associated insomnia.  Insomnia seems to be her most bothersome symptom.  Adding alprazolam 0.25 to 0.5 mg nightly as needed for a few weeks.   Meds ordered this encounter  Medications   DISCONTD: ALPRAZolam (XANAX) 0.5 MG tablet    Sig: Take 0.5-1 tablets (0.25-0.5 mg total) by mouth at bedtime as needed for sleep or anxiety.    Dispense:  20 tablet    Refill:  0   ALPRAZolam (XANAX) 0.5 MG tablet    Sig: Take 0.5-1 tablets (0.25-0.5 mg total) by mouth at bedtime as needed for sleep or anxiety.    Dispense:  20 tablet    Refill:  0    No follow-ups on file.    This visit occurred during the SARS-CoV-2 public health emergency.  Safety protocols were in place, including screening  questions prior to the visit, additional usage of staff PPE, and extensive cleaning of exam room while observing appropriate contact time as indicated for disinfecting solutions.

## 2023-08-13 NOTE — Assessment & Plan Note (Signed)
 She has had increased stress and anxiety with associated insomnia.  Insomnia seems to be her most bothersome symptom.  Adding alprazolam 0.25 to 0.5 mg nightly as needed for a few weeks.

## 2023-09-24 ENCOUNTER — Other Ambulatory Visit: Payer: Self-pay | Admitting: Family

## 2023-09-24 DIAGNOSIS — K4090 Unilateral inguinal hernia, without obstruction or gangrene, not specified as recurrent: Secondary | ICD-10-CM

## 2023-09-30 ENCOUNTER — Other Ambulatory Visit

## 2023-10-05 ENCOUNTER — Ambulatory Visit
Admission: RE | Admit: 2023-10-05 | Discharge: 2023-10-05 | Disposition: A | Discharge status: Home / Self Care | Source: Ambulatory Visit | Attending: Family | Admitting: Family

## 2023-10-05 DIAGNOSIS — K4090 Unilateral inguinal hernia, without obstruction or gangrene, not specified as recurrent: Secondary | ICD-10-CM

## 2023-10-13 ENCOUNTER — Other Ambulatory Visit: Payer: Self-pay | Admitting: Family

## 2023-10-13 DIAGNOSIS — R1909 Other intra-abdominal and pelvic swelling, mass and lump: Secondary | ICD-10-CM

## 2023-10-20 ENCOUNTER — Ambulatory Visit
Admission: RE | Admit: 2023-10-20 | Discharge: 2023-10-20 | Disposition: A | Discharge status: Home / Self Care | Source: Ambulatory Visit | Attending: Family | Admitting: Family

## 2023-10-20 DIAGNOSIS — R1909 Other intra-abdominal and pelvic swelling, mass and lump: Secondary | ICD-10-CM

## 2023-10-20 MED ORDER — IOPAMIDOL (ISOVUE-300) INJECTION 61%
100.0000 mL | Freq: Once | INTRAVENOUS | Status: AC | PRN
  Administered 2023-10-20: 100 mL via INTRAVENOUS

## 2023-10-22 ENCOUNTER — Ambulatory Visit: Admitting: Family Medicine

## 2023-10-25 ENCOUNTER — Telehealth: Admitting: Family

## 2023-10-25 DIAGNOSIS — H109 Unspecified conjunctivitis: Secondary | ICD-10-CM

## 2023-10-25 MED ORDER — OFLOXACIN 0.3 % OP SOLN: 1.0000 [drp] | Freq: Four times a day (QID) | OPHTHALMIC | 0 refills | Status: DC

## 2023-10-25 NOTE — Progress Notes (Signed)
E-Visit for Pink Eye   We are sorry that you are not feeling well.  Here is how we plan to help!  Based on what you have shared with me it looks like you have conjunctivitis.  Conjunctivitis is a common inflammatory or infectious condition of the eye that is often referred to as "pink eye".  In most cases it is contagious (viral or bacterial). However, not all conjunctivitis requires antibiotics (ex. Allergic).  We have made appropriate suggestions for you based upon your presentation.  I have prescribed Oflaxacin 1-2 drops 4 times a day times 5 days   Pink eye can be highly contagious.  It is typically spread through direct contact with secretions, or contaminated objects or surfaces that one may have touched.  Strict handwashing is suggested with soap and water is urged.  If not available, use alcohol based had sanitizer.  Avoid unnecessary touching of the eye.  If you wear contact lenses, you will need to refrain from wearing them until you see no white discharge from the eye for at least 24 hours after being on medication.  You should see symptom improvement in 1-2 days after starting the medication regimen.  Call us if symptoms are not improved in 1-2 days.  Home Care: Wash your hands often! Do not wear your contacts until you complete your treatment plan. Avoid sharing towels, bed linen, personal items with a person who has pink eye. See attention for anyone in your home with similar symptoms.  Get Help Right Away If: Your symptoms do not improve. You develop blurred or loss of vision. Your symptoms worsen (increased discharge, pain or redness)   Thank you for choosing an e-visit.  Your e-visit answers were reviewed by a board certified advanced clinical practitioner to complete your personal care plan. Depending upon the condition, your plan could have included both over the counter or prescription medications.  Please review your pharmacy choice. Make sure the pharmacy is open so  you can pick up prescription now. If there is a problem, you may contact your provider through MyChart messaging and have the prescription routed to another pharmacy.  Your safety is important to us. If you have drug allergies check your prescription carefully.   For the next 24 hours you can use MyChart to ask questions about today's visit, request a non-urgent call back, or ask for a work or school excuse. You will get an email in the next two days asking about your experience. I hope that your e-visit has been valuable and will speed your recovery.  Approximately 5 minutes was spent documenting and reviewing patient's chart.  

## 2023-11-09 ENCOUNTER — Ambulatory Visit: Admitting: Family Medicine

## 2023-11-16 DIAGNOSIS — K4090 Unilateral inguinal hernia, without obstruction or gangrene, not specified as recurrent: Secondary | ICD-10-CM | POA: Insufficient documentation

## 2023-11-25 ENCOUNTER — Ambulatory Visit: Admitting: Family Medicine

## 2023-11-25 ENCOUNTER — Encounter: Payer: Self-pay | Admitting: Family Medicine

## 2023-11-25 VITALS — BP 135/84 | HR 63 | Ht 62.0 in | Wt 170.0 lb

## 2023-11-25 DIAGNOSIS — R102 Pelvic and perineal pain: Secondary | ICD-10-CM

## 2023-11-25 DIAGNOSIS — R1900 Intra-abdominal and pelvic swelling, mass and lump, unspecified site: Secondary | ICD-10-CM

## 2023-11-25 DIAGNOSIS — I10 Essential (primary) hypertension: Secondary | ICD-10-CM

## 2023-11-25 DIAGNOSIS — G8929 Other chronic pain: Secondary | ICD-10-CM | POA: Insufficient documentation

## 2023-11-25 MED ORDER — VALSARTAN 80 MG PO TABS: 80.0000 mg | ORAL_TABLET | Freq: Every day | ORAL | 3 refills | Status: AC

## 2023-11-25 NOTE — Assessment & Plan Note (Signed)
 She has had pelvic pain for >3 months.  Seen by GYN and gen surgery.  Will request 2nd opinion from another general surgeon as hernias can often be missed on CT.  Pelvic MRI ordered as well given concerns of lymphadenopathy and ongoing pain.  Needs to update colon cancer screening.  Referral placed.

## 2023-11-25 NOTE — Progress Notes (Signed)
 Gabriela Howard - 63 y.o. female MRN [id number]  Date of birth: February 04, 1961  Subjective Chief Complaint  Patient presents with   Pelvic Pain    HPI Gabriela Howard is a 63 y.o. female here today for follow up visit.   She was seen by Gyn for pelvic pain.  Initially had pelvic ultrasound which showed hypoechoic lesion in the right groin, thought to be abnormal lymph node.  CT scan was ordered to further evaluate this area.  This showed mild fecal retention but no other significant findins.  She did see general surgery and was told she did not have a hernia.  She reports that she was referred to ID but has not heard anything regarding this. She describes pain as fullness in the R groin area.  Pressing over the area can cause radiation into the right leg.  She is s/p hysterectomy.  She denies urinary symptoms or vaginal discharge.  Denies rectal pain.  She is due for updated colonoscopy.  She has not noted any skin lesions.  History of uncle with Melanoma.   ROS:  A comprehensive ROS was completed and negative except as noted per HPI    Allergies  Allergen Reactions   Aleve [Naproxen] Nausea And Vomiting and Nausea Only    Other reaction(s): Dizziness, sick,  Reaction:  GI upset    Aleve [Naproxen Sodium] Other (See Comments)    Reaction:  GI upset    Sulfamethoxazole-Trimethoprim     Other reaction(s): hives   Other Nausea Only    MEDICATION TO NUMB GUMS FOR DEEP CLEANING LIGHTHEADNESS, AND JITTERY PER PT.      Past Medical History:  Diagnosis Date   Anxiety    Colon polyps    Depression    High cholesterol    Insomnia    Uterine fibroid     Past Surgical History:  Procedure Laterality Date   ABDOMINAL HYSTERECTOMY  05/03/2016   LAVH, total   WRIST SURGERY      Social History   Socioeconomic History   Marital status: Married    Spouse name: Not on file   Number of children: 3   Years of education: Not on file   Highest education level: Not on file  Occupational  History   Occupation: IT    Employer: [employer]  Tobacco Use   Smoking status: Former    Current packs/day: 0.00    Average packs/day: 1 pack/day for 12.0 years (12.0 ttl pk-yrs)    Types: Cigarettes    Start date: 89    Quit date: 1994    Years since quitting: 31.5   Smokeless tobacco: Never  Vaping Use   Vaping status: Never Used  Substance and Sexual Activity   Alcohol use: Yes    Alcohol/week: 1.0 standard drink of alcohol    Types: 1 Standard drinks or equivalent per week    Comment: occ   Drug use: No   Sexual activity: Not Currently    Birth control/protection: Surgical, Abstinence    Comment: LAVH  Other Topics Concern   Not on file  Social History Narrative   Not on file   Social Drivers of Health   Financial Resource Strain: Not on file  Food Insecurity: Not on file  Transportation Needs: Not on file  Physical Activity: Not on file  Stress: No Stress Concern Present (07/23/2020)   Received from [facility] of Occupational Health - Occupational Stress Questionnaire  Feeling of Stress : Not at all  Social Connections: Unknown (10/04/2021)   [facility] from [facility]   Social Network    Social Network: Not on file    Family History  Problem Relation Age of Onset   Thyroid disease Mother        Blood clots   Hypertension Mother    Heart disease Father    Alzheimer's disease Father    High Cholesterol Father    Hyperlipidemia Brother    Prostate cancer Maternal Uncle     Health Maintenance  Topic Date Due   HIV Screening  Never done   Hepatitis C Screening  Never done   Cervical Cancer Screening (HPV/Pap Cotest)  Never done   Zoster Vaccines- Shingrix  (2 of 2) 01/06/2020   MAMMOGRAM  04/25/2022   Colonoscopy  09/16/2022   COVID-19 Vaccine (4 - 2024-25 season) 01/25/2023   INFLUENZA VACCINE  12/25/2023   DTaP/Tdap/Td (3 - Td or Tdap) 07/12/2030   Hepatitis B Vaccines  Aged Out   HPV VACCINES  Aged Out    Meningococcal B Vaccine  Aged Out     ----------------------------------------------------------------------------------------------------------------------------------------------------------------------------------------------------------------- Physical Exam BP 135/84 (BP Location: Left Arm, Patient Position: Sitting, Cuff Size: Normal)   Pulse 63   Ht 5' 2 (1.575 m)   Wt 170 lb (77.1 kg)   LMP 04/05/2016   SpO2 99%   BMI 31.09 kg/m   Physical Exam Constitutional:      Appearance: Normal appearance.  HENT:     Head: Normocephalic and atraumatic.  Cardiovascular:     Rate and Rhythm: Normal rate and regular rhythm.  Abdominal:     General: There is no distension.     Palpations: Abdomen is soft.     Comments: Fullness in the R groin area with TTP.    Skin:    Comments: No visible skin lesions noted on lower extremities or pelvis.   Neurological:     Mental Status: She is alert.     ------------------------------------------------------------------------------------------------------------------------------------------------------------------------------------------------------------------- Assessment and Plan  Chronic pelvic pain in female She has had pelvic pain for >3 months.  Seen by GYN and gen surgery.  Will request 2nd opinion from another general surgeon as hernias can often be missed on CT.  Pelvic MRI ordered as well given concerns of lymphadenopathy and ongoing pain.  Needs to update colon cancer screening.  Referral placed.     Meds ordered this encounter  Medications   valsartan  (DIOVAN ) 80 MG tablet    Sig: Take 1 tablet (80 mg total) by mouth daily.    Dispense:  90 tablet    Refill:  3    No follow-ups on file.

## 2023-11-30 ENCOUNTER — Other Ambulatory Visit

## 2023-12-01 ENCOUNTER — Ambulatory Visit

## 2023-12-01 DIAGNOSIS — R102 Pelvic and perineal pain: Secondary | ICD-10-CM

## 2023-12-01 DIAGNOSIS — G8929 Other chronic pain: Secondary | ICD-10-CM

## 2023-12-01 MED ORDER — GADOBUTROL 1 MMOL/ML IV SOLN
7.5000 mL | Freq: Once | INTRAVENOUS | Status: AC | PRN
  Administered 2023-12-01: 7.5 mL via INTRAVENOUS

## 2023-12-02 ENCOUNTER — Ambulatory Visit: Payer: Self-pay | Admitting: Family Medicine

## 2023-12-07 ENCOUNTER — Telehealth: Payer: Self-pay

## 2023-12-07 NOTE — Telephone Encounter (Signed)
 Copied from CRM [id number]. Topic: Clinical - Medical Advice >> Dec 07, 2023 11:52 AM Zane F wrote: Reason for CRM:    Patient is calling in after not receiving word back on whether she should go in to see the general surgeon on 12/09/2023 at 3:15 pm; CAL informed specialist Dr. Alvia would not be back in office until the 17 th which is after the patient's appointment. Specialist informed patient. Patient will contact the surgeon office and postpone visit until after she can discuss the matter with her PCP.  Callback Number: [phone]

## 2023-12-11 MED ORDER — PREDNISONE 10 MG (21) PO TBPK: ORAL_TABLET | ORAL | 0 refills | Status: DC

## 2023-12-11 MED ORDER — DOXYCYCLINE HYCLATE 100 MG PO TABS: 100.0000 mg | ORAL_TABLET | Freq: Two times a day (BID) | ORAL | 0 refills | Status: DC

## 2023-12-15 ENCOUNTER — Ambulatory Visit: Admitting: Family Medicine

## 2023-12-16 ENCOUNTER — Ambulatory Visit: Payer: Self-pay

## 2023-12-16 NOTE — Telephone Encounter (Signed)
 FYI Only or Action Required?: Action required by provider: clinical question for provider and update on patient condition.  Patient was last seen in primary care on 11/25/2023 by [name], DO.  Called Nurse Triage reporting Leg Pain.  Symptoms began several months ago.  Interventions attempted: Prescription medications: prednisone  and doxycycline .  Symptoms are: gradually worsening.  Triage Disposition: See Physician Within 24 Hours  Patient/caregiver understands and will follow disposition?: No, wishes to speak with PCP                             Copied from CRM #[id number]. Topic: Clinical - Red Word Triage >> Dec 16, 2023 12:35 PM Kevelyn M wrote: Red Word that prompted transfer to Nurse Triage: Patient called because she's experiencing worsening pain in her right leg. She has already seen her doctor for this before. Pain is so bad she's losing sleep. Pain has gotten worse over the last 1.5 weeks. Reason for Disposition  Numbness in a leg or foot (i.e., loss of sensation)  Answer Assessment - Initial Assessment Questions 1. ONSET: When did the pain start?      Started in April, worsening over 1-2 weeks  2. LOCATION: Where is the pain located?      Right leg, radiates from groin down leg 3. PAIN: How bad is the pain?    (Scale 1-10; or mild, moderate, severe)     Rates pain a 6  4. WORK OR EXERCISE: Has there been any recent work or exercise that involved this part of the body?      Denies 5. CAUSE: What do you think is causing the leg pain?     Unsure 6. OTHER SYMPTOMS: Do you have any other symptoms? (e.g., chest pain, back pain, breathing difficulty, swelling, rash, fever, numbness, weakness)     States her right leg feels week when bearing weight, states pain becomes sharp when she bends over, numbness when pain intensifies    Was recently (OV on 7/2) put on prednisone  and an antibiotic by PCP and denies relief. Patient declined  appointment and is seeking feedback from provider on what to do from here. Please advise.  Protocols used: Leg Pain-A-AH

## 2023-12-16 NOTE — Telephone Encounter (Signed)
 She reports the pain that starts in the groin area and down the leg is now constant. Before the pain just came and went. Now she is having trouble sleeping with the pain. She would like to know the next step.

## 2023-12-17 ENCOUNTER — Ambulatory Visit: Payer: Self-pay

## 2023-12-17 ENCOUNTER — Telehealth: Payer: Self-pay

## 2023-12-17 ENCOUNTER — Other Ambulatory Visit: Payer: Self-pay | Admitting: Family Medicine

## 2023-12-17 DIAGNOSIS — M25551 Pain in right hip: Secondary | ICD-10-CM

## 2023-12-17 NOTE — Telephone Encounter (Signed)
 Patient called back. She also wanted to add that she is having trouble sleeping related to pain in her hip/legs/groin. She states the top of her right foot swells. She was advised that we are awaiting a response from Dr. Donnice. She declined an appointment and would like to speak with provider, and if he wants her to come in, she will.

## 2023-12-17 NOTE — Telephone Encounter (Signed)
 FYI Only or Action Required?: Action required by provider: clinical question for provider and update on patient condition.  Patient was last seen in primary care on 11/25/2023 by [name], DO.  Called Nurse Triage reporting Hip Pain and Awaiting provider advise.  Symptoms began several days ago.  Interventions attempted: Prescription medications: finished prednisone  and antibiotic and Rest, hydration, or home remedies.  Symptoms are: gradually worsening.  Triage Disposition: Call PCP Now  Patient/caregiver understands and will follow disposition?: No, wishes to speak with PCP   Answer Assessment - Initial Assessment Questions 1. REASON FOR CALL or QUESTION: What is your reason for calling today? or How can I best   Patient calling back from 12/16/23 she is awaiting recommendation from pcp re: worsening hip pain.   No appointments are available to offer for follow up until August 5th  See triage encounter from 12/16/23. Patient request follow up call today.  Protocols used: PCP Call - No Triage-A-AH

## 2023-12-17 NOTE — Telephone Encounter (Signed)
 Call disconnected during transfer to triage, patient is awaiting a response from Dr. Alvia regarding hip pain.  See previous encounter*            Copied from CRM #[id number]. Topic: Clinical - Red Word Triage >> Dec 17, 2023  1:08 PM Brittney F wrote: [facility] that prompted transfer to Nurse Triage: Patient is currently having hip pain (pain level is a 4 or 5 out of 10) Reason for Disposition  [1] Caller requests to speak ONLY to PCP AND [2] URGENT question  Protocols used: PCP Call - No Triage-A-AH

## 2023-12-17 NOTE — Telephone Encounter (Signed)
 Copied from CRM [id number]. Topic: Clinical - Red Word Triage >> Dec 16, 2023 12:35 PM Kevelyn M wrote: Red Word that prompted transfer to Nurse Triage: Patient called because she's experiencing worsening pain in her right leg. She has already seen her doctor for this before. Pain is so bad she's losing sleep. Pain has gotten worse over the last 1.5 weeks. >> Dec 17, 2023  1:08 PM Zane F wrote: Patient called in to check for an update on a a possible response from her provider; The patient was informed there was not an update from her PCP and is now looking to discuss a referral for Rheumatology.  Patient stated she the top of her right foot gets so swollen she can barely walk on it; hip pain ; shoulder pain all on right side.   Patient is currently having hip pain (pain level is a 4 or 5 out of 10)

## 2023-12-17 NOTE — Telephone Encounter (Signed)
 Attempted call to patient. Left a detailed voice mail message on patient listed home # - allowed on DPR -

## 2023-12-17 NOTE — Addendum Note (Signed)
 Addended by: Kerly Rigsbee E on: 12/17/2023 04:36 PM   Modules accepted: Orders

## 2023-12-21 NOTE — Telephone Encounter (Signed)
 Patient advised. She is going to hold off on doing the x-rays for now. She has been scheduled an appointment.

## 2023-12-21 NOTE — Telephone Encounter (Unsigned)
 Copied from CRM #[id number]. Topic: Clinical - Red Word Triage >> Dec 17, 2023  1:08 PM Brittney F wrote: Red Word that prompted transfer to Nurse Triage: Patient is currently having hip pain (pain level is a 4 or 5 out of 10) >> Dec 21, 2023  1:46 PM Miquel SAILOR wrote: Patient returning Luke RN called and transferred

## 2023-12-24 ENCOUNTER — Ambulatory Visit: Payer: Self-pay | Admitting: Family Medicine

## 2023-12-24 LAB — SEDIMENTATION RATE: Sed Rate: 2 mm/h (ref 0–40)

## 2023-12-24 LAB — C-REACTIVE PROTEIN: CRP: 5 mg/L (ref 0–10)

## 2023-12-24 LAB — ANA,IFA RA DIAG PNL W/RFLX TIT/PATN
ANA Titer 1: NEGATIVE
Cyclic Citrullin Peptide Ab: 14 U (ref 0–19)
Rheumatoid fact SerPl-aCnc: <10 [IU]/mL (ref <14.0)

## 2023-12-24 NOTE — Progress Notes (Signed)
 Andrea, I am covering for Dr. Alvia while he is out of the office.  Your inflammatory markers are normal and your test for lupus was negative.

## 2023-12-31 ENCOUNTER — Ambulatory Visit: Admitting: Family Medicine

## 2024-01-11 ENCOUNTER — Ambulatory Visit: Admitting: Urgent Care

## 2024-01-11 ENCOUNTER — Encounter: Payer: Self-pay | Admitting: Urgent Care

## 2024-01-11 VITALS — BP 101/67 | HR 67 | Ht 62.0 in | Wt 171.8 lb

## 2024-01-11 DIAGNOSIS — N61 Mastitis without abscess: Secondary | ICD-10-CM

## 2024-01-11 DIAGNOSIS — L309 Dermatitis, unspecified: Secondary | ICD-10-CM | POA: Diagnosis not present

## 2024-01-11 MED ORDER — CEPHALEXIN 500 MG PO CAPS
500.0000 mg | ORAL_CAPSULE | Freq: Four times a day (QID) | ORAL | 0 refills | Status: AC
Start: 2024-01-11 — End: 2024-01-18

## 2024-01-11 MED ORDER — TRIAMCINOLONE ACETONIDE 0.5 % EX CREA: 1.0000 | TOPICAL_CREAM | Freq: Two times a day (BID) | CUTANEOUS | 3 refills | Status: DC

## 2024-01-11 NOTE — Patient Instructions (Addendum)
 Take the cephalexin  (antibiotic) four times daily x 7 days. Use the topical triamcinolone  (steroid cream) twice daily to the affected areas of skin, other than breast.  Follow up in one week if not resolved.

## 2024-01-11 NOTE — Progress Notes (Signed)
 Established Patient Office Visit  Subjective:  Patient ID: Gabriela Howard, female    DOB: 04-20-1961  Age: 64 y.o. MRN: [id number]  Chief Complaint  Patient presents with   Rash    Patient c/o rash / bumps  - intermittently pruritic  - started with Right breast and upper extremities. Patient did start a new probiotic about 2 weeks ago.     HPI  Discussed the use of AI scribe software for clinical note transcription with the patient, who gave verbal consent to proceed.  History of Present Illness   Gabriela Howard is a 63 year old female who presents with a rash on her left breast and upper body.  Approximately a week and a half ago, she developed a rash on her left breast, initially thought to be a bite. The rash is long, wide, and extends down to the nipple area. It occasionally itches but is not constantly itchy. She has not applied any over-the-counter treatments to the rash.  Following the initial rash, she noticed small bumps appearing from the waist up, including on her neck, back, and arms. These bumps are not itchy, but she has scratched them. The bumps continue to appear while she is sitting and working.  She has a history of swollen lymph nodes for which she was prescribed antibiotics and steroids. She completed the antibiotic course but did not take the steroids due to personal aversion. She has a known allergy to sulfa antibiotics and a history of C. diff infection following high-dose amoxicillin  treatment for a previous thumb infection.  Her last mammogram was in December of the previous year and was normal. She has not experienced any new allergies or changes in her health regimen.      Patient Active Problem List   Diagnosis Date Noted   Chronic pelvic pain in female 11/25/2023   History of colonic polyps 05/13/2023   Palpitations 05/13/2023   Occlusion and stenosis of bilateral carotid arteries 04/01/2022   Burning with urination 04/01/2022   Primary  osteoarthritis of first carpometacarpal joint of right hand 03/18/2022   Painful orthopaedic hardware (HCC) 03/18/2022   Prediabetes 10/16/2021   Tick bite 10/16/2021   Wound infection 08/30/2020   Fever 08/30/2020   Metacarpal bone fracture 08/22/2020   S/P ORIF (open reduction internal fixation) fracture 08/22/2020   C. difficile diarrhea 08/22/2020   Hypertension 07/13/2020   Atherosclerosis 07/13/2020   Anxiety 07/13/2020   Pure hypercholesterolemia 07/13/2020   Hyperlipidemia 05/04/2020   Atypical squamous cells of undetermined significance (ASCUS) on Papanicolaou smear of cervix 03/23/2014   Vitamin deficiency syndrome 01/22/2011   Past Medical History:  Diagnosis Date   Anxiety    Colon polyps    Depression    High cholesterol    Insomnia    Uterine fibroid    Past Surgical History:  Procedure Laterality Date   ABDOMINAL HYSTERECTOMY  05/03/2016   LAVH, total   WRIST SURGERY     Social History   Tobacco Use   Smoking status: Former    Current packs/day: 0.00    Average packs/day: 1 pack/day for 12.0 years (12.0 ttl pk-yrs)    Types: Cigarettes    Start date: 68    Quit date: 1994    Years since quitting: 31.6   Smokeless tobacco: Never  Vaping Use   Vaping status: Never Used  Substance Use Topics   Alcohol use: Yes    Alcohol/week: 1.0 standard drink of alcohol    Types:  1 Standard drinks or equivalent per week    Comment: occ   Drug use: No      ROS: as noted in HPI  Objective:     BP 101/67   Pulse 67   Ht 5' 2 (1.575 m)   Wt 171 lb 12 oz (77.9 kg)   LMP 04/05/2016   SpO2 99%   BMI 31.41 kg/m  BP Readings from Last 3 Encounters:  01/11/24 101/67  11/25/23 135/84  08/13/23 98/62   Wt Readings from Last 3 Encounters:  01/11/24 171 lb 12 oz (77.9 kg)  11/25/23 170 lb (77.1 kg)  08/13/23 171 lb (77.6 kg)      Physical Exam Vitals and nursing note reviewed.  Constitutional:      General: She is not in acute distress.     Appearance: Normal appearance. She is normal weight. She is not ill-appearing.  Skin:    Findings: Rash present.     Comments: See photos of left breast. Area minimally raise, warm Small discoid area to L upper chest consistent with dermatitis.  Neurological:     Mental Status: She is alert.         No results found for any visits on 01/11/24.    The 10-year ASCVD risk score (Arnett DK, et al., 2019) is: 3.6%  Assessment & Plan:  Dermatitis, unspecified -     Triamcinolone  Acetonide; Apply 1 Application topically 2 (two) times daily. To affected areas.  Dispense: 30 g; Refill: 3  Cellulitis of left breast -     Cephalexin ; Take 1 capsule (500 mg total) by mouth 4 (four) times daily for 7 days.  Dispense: 28 capsule; Refill: 0   Assessment and Plan    Rash and inflammation of left breast Raised, hot rash on left breast extending to nipple. Highly suspect erysipelas. - Advise completion of oral antibiotic (keflex  QID), and report if no improvement after one week. Avoid steroids to breast at present time. - Consider repeat mammogram if no improvement after treatment to r/o inflammatory breast CA.  Dermatitis of trunk and upper extremities Rash with bumps on neck, back, and arms. Appears to be standard dermatitis. Declined previous PO steroid treatment. - Prescribe topical steroid cream twice daily to affected areas. - Prescribe cephalexin  500 mg four times daily for seven days. - Advise not to apply topical steroid if oral antibiotic resolves the rash. - Monitor rash and report if no improvement after one week.        No follow-ups on file.   Benton LITTIE Gave, PA

## 2024-01-18 ENCOUNTER — Telehealth: Payer: Self-pay

## 2024-01-18 DIAGNOSIS — Z1231 Encounter for screening mammogram for malignant neoplasm of breast: Secondary | ICD-10-CM

## 2024-01-18 DIAGNOSIS — L309 Dermatitis, unspecified: Secondary | ICD-10-CM

## 2024-01-18 NOTE — Telephone Encounter (Signed)
 Copied from CRM #[id number]. Topic: Clinical - Medical Advice >> Jan 18, 2024  8:59 AM Laurier BROCKS wrote: Reason for CRM: Patient states she was advised by Dr, Sheree that if her issue (rash)  has not gotten any better to contact her and let her know. Patient states the smaller spots that were discussed at her appointment on the 18th have gotten bigger. Patient would like to know if Dr. Lowella would  be willing to submit a referral to a dermatologist.

## 2024-01-19 ENCOUNTER — Ambulatory Visit: Admitting: Family Medicine

## 2024-01-20 ENCOUNTER — Telehealth: Payer: Self-pay

## 2024-01-20 NOTE — Telephone Encounter (Signed)
 Copied from CRM [id number]. Topic: Clinical - Request for Lab/Test Order >> Jan 20, 2024 10:22 AM Zane F wrote: Reason for CRM:   Patient is calling to inform the office that she would like the two mammogram orders cancelled so she can have them placed with her GYN instead.  Callback Number: [phone]

## 2024-01-20 NOTE — Telephone Encounter (Signed)
 O.k. to cancel mammogram orders?

## 2024-01-21 ENCOUNTER — Other Ambulatory Visit: Payer: Self-pay | Admitting: Obstetrics and Gynecology

## 2024-01-21 DIAGNOSIS — N6489 Other specified disorders of breast: Secondary | ICD-10-CM

## 2024-01-26 ENCOUNTER — Ambulatory Visit
Admission: RE | Admit: 2024-01-26 | Discharge: 2024-01-26 | Disposition: A | Discharge status: Home / Self Care | Source: Ambulatory Visit | Attending: Obstetrics and Gynecology | Admitting: Obstetrics and Gynecology

## 2024-01-26 ENCOUNTER — Ambulatory Visit

## 2024-01-26 DIAGNOSIS — N6489 Other specified disorders of breast: Secondary | ICD-10-CM

## 2024-02-05 ENCOUNTER — Ambulatory Visit: Admitting: Family Medicine

## 2024-02-18 ENCOUNTER — Telehealth: Payer: Self-pay | Admitting: Family Medicine

## 2024-02-18 NOTE — Addendum Note (Signed)
 Addended by: Draysen Weygandt Z on: 02/18/2024 01:11 PM   Modules accepted: Orders

## 2024-02-18 NOTE — Telephone Encounter (Signed)
Patient is requesting referral to Rheumatology

## 2024-02-18 NOTE — Telephone Encounter (Signed)
 Copied from CRM [id number]. Topic: Referral - Request for Referral >> Feb 18, 2024  9:48 AM Amy B wrote: Did the patient discuss referral with their provider in the last year? No (If No - schedule appointment) (If Yes - send message)  Appointment offered? No  Type of order/referral and detailed reason for visit: Rheumatology  Preference of office, provider, location: [facility] on [address] in [place], [place] fax [phone]  If referral order, have you been seen by this specialty before? No (If Yes, this issue or another issue? When? Where?  Can we respond through MyChart? Yes

## 2024-02-26 ENCOUNTER — Ambulatory Visit: Admitting: Family Medicine

## 2024-03-18 ENCOUNTER — Ambulatory Visit: Payer: Self-pay

## 2024-03-18 NOTE — Telephone Encounter (Signed)
 FYI Only or Action Required?: FYI only for provider.  Patient was last seen in primary care on 01/11/2024 by Lowella Benton CROME, PA.  Called Nurse Triage reporting Foot Swelling.  Symptoms began several months ago.  Interventions attempted: Nothing.  Symptoms are: stable.  Triage Disposition: See PCP Within 2 Weeks  Patient/caregiver understands and will follow disposition?: Yes Reason for Disposition  Foot pain is a chronic symptom (recurrent or ongoing AND present > 4 weeks)  Answer Assessment - Initial Assessment Questions States went to Ortho, Xray confirmed arthritis. Tried to get a referral to Rheumatology, and never heard anything and was told PCP wanted to see her.   1. ONSET: When did the pain start?      2-3 months ago  2. LOCATION: Where is the pain located?      Right foot, sometimes happens to the left  3. PAIN: How bad is the pain?    (Scale 1-10; or mild, moderate, severe)     Comes and goes, severe, sometimes can't feel it  4. CAUSE: What do you think is causing the foot pain?     Unsure, was told it was Arthritis  5. OTHER SYMPTOMS: Do you have any other symptoms? (e.g., leg pain, rash, fever, numbness)     Numbness  Protocols used: Foot Pain-A-AH  Copied from CRM [id number]. Topic: Clinical - Red Word Triage >> Mar 18, 2024 11:40 AM Treva T wrote: [facility] that prompted transfer to Nurse Triage: Patient is calling, states she is having worsening symptoms with foot pain, swelling, and difficulty walking, with increased arthritis issues.  Patient would like to have an appointment as soon as possible for evaluation.

## 2024-03-18 NOTE — Telephone Encounter (Signed)
 Appointment scheduled.

## 2024-03-22 ENCOUNTER — Encounter: Payer: Self-pay | Admitting: Family Medicine

## 2024-03-22 ENCOUNTER — Ambulatory Visit: Admitting: Family Medicine

## 2024-03-22 VITALS — BP 109/71 | HR 72 | Ht 62.0 in | Wt 178.0 lb

## 2024-03-22 DIAGNOSIS — G8929 Other chronic pain: Secondary | ICD-10-CM | POA: Diagnosis not present

## 2024-03-22 DIAGNOSIS — M2550 Pain in unspecified joint: Secondary | ICD-10-CM | POA: Insufficient documentation

## 2024-03-22 DIAGNOSIS — R102 Pelvic and perineal pain unspecified side: Secondary | ICD-10-CM

## 2024-03-22 NOTE — Assessment & Plan Note (Signed)
 Autoimmune labs have been negative.  Insistent on rheumatology referral.  Orders entered.

## 2024-03-22 NOTE — Patient Instructions (Signed)
  Try voltaren gel on foot.

## 2024-03-22 NOTE — Assessment & Plan Note (Signed)
 Continues to have right hip and groin pain.  Still feels some fullness in this area.  Imaging including MRI has been normal.  She is not having some numbness and tingling into the right leg.  We discussed that her lower back may be the cause of this.  She declines imaging of her back at this time.

## 2024-03-22 NOTE — Progress Notes (Signed)
 Gabriela Howard - 63 y.o. female MRN [id number]  Date of birth: [birth year]  Subjective Chief Complaint  Patient presents with   Mass   Groin Pain   Numbness    HPI Gabriela Howard is a 63 y.o. female here today with complaint of right foot pain.  She first noticed this several weeks ago.  She was seen at ortho and told that she has OA around the TMT.   Per orthopedic note she was recommended to use conservative measures.  She is using tumeric.  She reports that she was told that she needed to return to PCP and have referral placed to rheumatology due to continued hip pain and foot pain.  She continues to have hip pain with swelling sensation.  She is not having some numbness and tingling at times into the right leg.  ROS:  A comprehensive ROS was completed and negative except as noted per HPI  Allergies  Allergen Reactions   Aleve [Naproxen] Nausea And Vomiting and Nausea Only    Other reaction(s): Dizziness, sick,  Reaction:  GI upset    Aleve [Naproxen Sodium] Other (See Comments)    Reaction:  GI upset    Sulfamethoxazole-Trimethoprim     Other reaction(s): hives   Other Nausea Only    MEDICATION TO NUMB GUMS FOR DEEP CLEANING LIGHTHEADNESS, AND JITTERY PER PT.      Past Medical History:  Diagnosis Date   Anxiety    Colon polyps    Depression    High cholesterol    Insomnia    Uterine fibroid     Past Surgical History:  Procedure Laterality Date   ABDOMINAL HYSTERECTOMY  05/03/2016   LAVH, total   WRIST SURGERY      Social History   Socioeconomic History   Marital status: Married    Spouse name: Not on file   Number of children: 3   Years of education: Not on file   Highest education level: Not on file  Occupational History   Occupation: IT    Employer: [employer]  Tobacco Use   Smoking status: Former    Current packs/day: 0.00    Average packs/day: 1 pack/day for 12.0 years (12.0 ttl pk-yrs)    Types: Cigarettes    Start date: 62     Quit date: 92    Years since quitting: 31.8   Smokeless tobacco: Never  Vaping Use   Vaping status: Never Used  Substance and Sexual Activity   Alcohol use: Yes    Alcohol/week: 1.0 standard drink of alcohol    Types: 1 Standard drinks or equivalent per week    Comment: occ   Drug use: No   Sexual activity: Not Currently    Birth control/protection: Surgical, Abstinence    Comment: LAVH  Other Topics Concern   Not on file  Social History Narrative   Not on file   Social Drivers of Health   Financial Resource Strain: Low Risk  (03/22/2024)   Overall Financial Resource Strain (CARDIA)    Difficulty of Paying Living Expenses: Not hard at all  Food Insecurity: No Food Insecurity (03/22/2024)   Hunger Vital Sign    Worried About Running Out of Food in the Last Year: Never true    Ran Out of Food in the Last Year: Never true  Transportation Needs: No Transportation Needs (03/22/2024)   PRAPARE - Administrator, Civil Service (Medical): No    Lack of Transportation (Non-Medical): No  Physical Activity: Insufficiently Active (03/22/2024)   Exercise Vital Sign    Days of Exercise per Week: 3 days    Minutes of Exercise per Session: 40 min  Stress: No Stress Concern Present (03/22/2024)   [employer] of Occupational Health - Occupational Stress Questionnaire    Feeling of Stress: Only a little  Social Connections: Moderately Integrated (03/22/2024)   Social Connection and Isolation Panel    Frequency of Communication with Friends and Family: Three times a week    Frequency of Social Gatherings with Friends and Family: Once a week    Attends Religious Services: Never    Database Administrator or Organizations: Yes    Attends Banker Meetings: 1 to 4 times per year    Marital Status: Married    Family History  Problem Relation Age of Onset   Thyroid disease Mother        Blood clots   Hypertension Mother    Heart disease Father    Alzheimer's  disease Father    High Cholesterol Father    Hyperlipidemia Brother    Prostate cancer Maternal Uncle     Health Maintenance  Topic Date Due   HIV Screening  Never done   Hepatitis C Screening  Never done   Colonoscopy  09/16/2022   Zoster Vaccines- Shingrix  (2 of 2) 06/22/2024 (Originally 01/06/2020)   Influenza Vaccine  08/23/2024 (Originally 12/25/2023)   Pneumococcal Vaccine: 50+ Years (1 of 1 - PCV) 03/22/2025 (Originally 03/07/2011)   COVID-19 Vaccine (4 - 2025-26 season) 04/07/2025 (Originally 01/25/2024)   Cervical Cancer Screening (HPV/Pap Cotest)  10/08/2024   Mammogram  04/21/2025   DTaP/Tdap/Td (3 - Td or Tdap) 07/12/2030   Hepatitis B Vaccines 19-59 Average Risk  Aged Out   HPV VACCINES  Aged Out   Meningococcal B Vaccine  Aged Out     ----------------------------------------------------------------------------------------------------------------------------------------------------------------------------------------------------------------- Physical Exam BP 109/71 (BP Location: Left Arm, Patient Position: Sitting, Cuff Size: Normal)   Pulse 72   Ht 5' 2 (1.575 m)   Wt 178 lb (80.7 kg)   LMP 04/05/2016   SpO2 98%   BMI 32.56 kg/m   Physical Exam Constitutional:      Appearance: Normal appearance.  HENT:     Head: Normocephalic and atraumatic.  Eyes:     General: No scleral icterus. Cardiovascular:     Rate and Rhythm: Normal rate and regular rhythm.  Pulmonary:     Effort: Pulmonary effort is normal.     Breath sounds: Normal breath sounds.  Musculoskeletal:     Cervical back: Neck supple.  Neurological:     Mental Status: She is alert.  Psychiatric:        Mood and Affect: Mood normal.        Behavior: Behavior normal.     ------------------------------------------------------------------------------------------------------------------------------------------------------------------------------------------------------------------- Assessment and  Plan  Chronic pelvic pain in female Continues to have right hip and groin pain.  Still feels some fullness in this area.  Imaging including MRI has been normal.  She is not having some numbness and tingling into the right leg.  We discussed that her lower back may be the cause of this.  She declines imaging of her back at this time.  Polyarthralgia Autoimmune labs have been negative.  Insistent on rheumatology referral.  Orders entered.   No orders of the defined types were placed in this encounter.   No follow-ups on file.
